# Patient Record
Sex: Male | Born: 1990
Health system: Southern US, Community
[De-identification: ages and names within clinical notes are randomized; demographics above are authoritative.]

## PROBLEM LIST (undated history)

## (undated) DIAGNOSIS — B009 Herpesviral infection, unspecified: Secondary | ICD-10-CM

## (undated) DIAGNOSIS — M5430 Sciatica, unspecified side: Secondary | ICD-10-CM

## (undated) DIAGNOSIS — G43909 Migraine, unspecified, not intractable, without status migrainosus: Secondary | ICD-10-CM

## (undated) DIAGNOSIS — K219 Gastro-esophageal reflux disease without esophagitis: Secondary | ICD-10-CM

## (undated) HISTORY — DX: Herpesviral infection, unspecified: B00.9

---
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
## 2013-02-15 ENCOUNTER — Encounter (HOSPITAL_BASED_OUTPATIENT_CLINIC_OR_DEPARTMENT_OTHER): Payer: Self-pay

## 2013-02-15 ENCOUNTER — Emergency Department (HOSPITAL_BASED_OUTPATIENT_CLINIC_OR_DEPARTMENT_OTHER)
Admission: EM | Admit: 2013-02-15 | Discharge: 2013-02-15 | Disposition: A | Discharge status: Home / Self Care | Payer: Medicaid Other | Attending: Emergency Medicine | Admitting: Emergency Medicine

## 2013-02-15 DIAGNOSIS — R143 Flatulence: Secondary | ICD-10-CM | POA: Insufficient documentation

## 2013-02-15 DIAGNOSIS — N342 Other urethritis: Secondary | ICD-10-CM

## 2013-02-15 DIAGNOSIS — R141 Gas pain: Secondary | ICD-10-CM | POA: Insufficient documentation

## 2013-02-15 DIAGNOSIS — R142 Eructation: Secondary | ICD-10-CM | POA: Insufficient documentation

## 2013-02-15 DIAGNOSIS — R109 Unspecified abdominal pain: Secondary | ICD-10-CM

## 2013-02-15 DIAGNOSIS — R1013 Epigastric pain: Secondary | ICD-10-CM | POA: Insufficient documentation

## 2013-02-15 DIAGNOSIS — K219 Gastro-esophageal reflux disease without esophagitis: Secondary | ICD-10-CM | POA: Insufficient documentation

## 2013-02-15 LAB — CBC WITH DIFFERENTIAL/PLATELET
Basophils Relative: 0 % (ref 0–1)
Eosinophils Absolute: 0.1 10*3/uL (ref 0.0–0.7)
Eosinophils Relative: 1 % (ref 0–5)
HCT: 49 % (ref 39.0–52.0)
Hemoglobin: 17.6 g/dL — ABNORMAL HIGH (ref 13.0–17.0)
Lymphocytes Relative: 29 % (ref 12–46)
Monocytes Absolute: 0.7 10*3/uL (ref 0.1–1.0)
Monocytes Relative: 8 % (ref 3–12)
Neutro Abs: 5.8 10*3/uL (ref 1.7–7.7)
Neutrophils Relative %: 62 % (ref 43–77)
WBC: 9.3 10*3/uL (ref 4.0–10.5)

## 2013-02-15 LAB — URINALYSIS, ROUTINE W REFLEX MICROSCOPIC
Bilirubin Urine: NEGATIVE
Glucose, UA: NEGATIVE mg/dL
Hgb urine dipstick: NEGATIVE
Specific Gravity, Urine: 1.025 (ref 1.005–1.030)
pH: 7.5 (ref 5.0–8.0)

## 2013-02-15 LAB — COMPREHENSIVE METABOLIC PANEL
ALT: 21 U/L (ref 0–53)
Albumin: 4.8 g/dL (ref 3.5–5.2)
BUN: 15 mg/dL (ref 6–23)
Calcium: 10.1 mg/dL (ref 8.4–10.5)
GFR calc Af Amer: >90 mL/min (ref >90)
Glucose, Bld: 99 mg/dL (ref 70–99)
Sodium: 139 mEq/L (ref 135–145)
Total Protein: 8.1 g/dL (ref 6.0–8.3)

## 2013-02-15 LAB — LIPASE, BLOOD: Lipase: 41 U/L (ref 11–59)

## 2013-02-15 LAB — URINE MICROSCOPIC-ADD ON

## 2013-02-15 MED ORDER — DOXYCYCLINE HYCLATE 100 MG PO CAPS: ORAL_CAPSULE | ORAL | Status: DC

## 2013-02-15 MED ORDER — OMEPRAZOLE 20 MG PO CPDR: 20.0000 mg | DELAYED_RELEASE_CAPSULE | Freq: Every day | ORAL | Status: DC

## 2013-02-15 NOTE — ED Provider Notes (Signed)
History     CSN: [id number]  Arrival date & time 02/15/13  1228   First MD Initiated Contact with Patient 02/15/13 1309      Chief Complaint  Patient presents with  . Abdominal Pain    (Consider location/radiation/quality/duration/timing/severity/associated sxs/prior treatment) HPI Comments: Patient is a 22 y/o M with no significant PMHx c/o epigastrum pain x 2 weeks. Patient described that pain is a constant, severe, aching, ganwing, burning sensation that is localized to the epigastrum without radiation. Patient reported pain gets worse when lying down and eating, stated that standing and walking the discomfort is much less. Patient reported using Milk of Magnesia and Peptobismol with no relief. Associated symptoms are feeling bloated. Patient denied dysphagia, odynphagia, sore throat, hemoptysis, hematemesis, hematochezia, melena, constipation, nausea, vomiting, diarrhea, sore throat, chest pain, shortness of breathe, difficulty breathing, dysuria, penile discharge, penile swelling.   Patient is a 22 y.o. male presenting with abdominal pain. The history is provided by the patient. No language interpreter was used.  Abdominal Pain Pain location:  Epigastric Pain quality: aching, bloating, burning, dull, fullness and gnawing   Pain quality: not cramping, not sharp and not shooting   Pain radiates to:  Does not radiate Pain severity:  Severe Onset quality:  Gradual Duration:  2 weeks Timing:  Constant Progression:  Unchanged Chronicity:  New Context: not alcohol use, not diet changes and not medication withdrawal   Relieved by:  Nothing Worsened by:  Position changes and eating Ineffective treatments:  OTC medications Associated symptoms: no chest pain, no chills, no constipation, no diarrhea, no dysuria, no fever, no hematochezia, no hematuria, no melena, no nausea, no shortness of breath, no sore throat and no vomiting   Risk factors: no alcohol abuse and no NSAID use      History reviewed. No pertinent past medical history.  History reviewed. No pertinent past surgical history.  History reviewed. No pertinent family history.  History  Substance Use Topics  . Smoking status: Passive Smoke Exposure - Never Smoker  . Smokeless tobacco: Never Used  . Alcohol Use: No      Review of Systems  Constitutional: Negative for fever and chills.  HENT: Negative for sore throat.   Respiratory: Negative for chest tightness and shortness of breath.   Cardiovascular: Negative for chest pain.  Gastrointestinal: Positive for abdominal pain. Negative for nausea, vomiting, diarrhea, constipation, blood in stool, melena and hematochezia.  Genitourinary: Negative for dysuria, hematuria, discharge and testicular pain.  Musculoskeletal: Negative for back pain.  Neurological: Negative for headaches.  All other systems reviewed and are negative.    Allergies  Review of patient's allergies indicates no known allergies.  Home Medications   Current Outpatient Rx  Name  Route  Sig  Dispense  Refill  . doxycycline (VIBRAMYCIN) 100 MG capsule      Take one PO BID x 7 days   14 capsule   0   . omeprazole (PRILOSEC) 20 MG capsule   Oral   Take 1 capsule (20 mg total) by mouth daily.   10 capsule   0     BP 128/78  Pulse 97  Temp(Src) 98.3 F (36.8 C) (Oral)  Resp 18  Ht 5\' 10"  (1.778 m)  Wt 170 lb (77.111 kg)  BMI 24.39 kg/m2  SpO2 99%  Physical Exam  Nursing note and vitals reviewed. Constitutional: He is oriented to person, place, and time. He appears well-developed and well-nourished. No distress.  HENT:  Head: Normocephalic and atraumatic.  Mouth/Throat: Oropharynx is clear and moist. No oropharyngeal exudate.  Eyes: Conjunctivae and EOM are normal. Pupils are equal, round, and reactive to light. Right eye exhibits no discharge. Left eye exhibits no discharge.  Neck: Normal range of motion. Neck supple. No tracheal deviation present.  Negative  lymphadenopathy  Cardiovascular: Normal rate, regular rhythm, normal heart sounds and intact distal pulses.  Exam reveals no friction rub.   No murmur heard. Pulmonary/Chest: Effort normal and breath sounds normal. No respiratory distress. He has no wheezes. He has no rales.  Abdominal: Soft. Bowel sounds are normal. He exhibits no distension and no mass. There is tenderness. There is no rebound and no guarding.  Pain upon palpation to epigastrum.  Non-tender in remaining 4 quadrants.  Negative Murphy's sign Negative Psoas sign Negative Obtruator sign  Lymphadenopathy:    He has no cervical adenopathy.  Neurological: He is alert and oriented to person, place, and time. No cranial nerve deficit. He exhibits normal muscle tone. Coordination normal.  Skin: Skin is warm and dry. No rash noted. He is not diaphoretic. No erythema.  Psychiatric: He has a normal mood and affect. His behavior is normal.    ED Course  Procedures (including critical care time)  Labs Reviewed  URINALYSIS, ROUTINE W REFLEX MICROSCOPIC - Abnormal; Notable for the following:    APPearance TURBID (*)    All other components within normal limits  URINE MICROSCOPIC-ADD ON - Abnormal; Notable for the following:    Bacteria, UA FEW (*)    All other components within normal limits  CBC WITH DIFFERENTIAL - Abnormal; Notable for the following:    RBC 6.11 (*)    Hemoglobin 17.6 (*)    All other components within normal limits  GC/CHLAMYDIA PROBE AMP  COMPREHENSIVE METABOLIC PANEL  LIPASE, BLOOD   Results for orders placed during the hospital encounter of 02/15/13  URINALYSIS, ROUTINE W REFLEX MICROSCOPIC      Result Value Range   Color, Urine YELLOW  YELLOW   APPearance TURBID (*) CLEAR   Specific Gravity, Urine 1.025  1.005 - 1.030   pH 7.5  5.0 - 8.0   Glucose, UA NEGATIVE  NEGATIVE mg/dL   Hgb urine dipstick NEGATIVE  NEGATIVE   Bilirubin Urine NEGATIVE  NEGATIVE   Ketones, ur NEGATIVE  NEGATIVE mg/dL    Protein, ur NEGATIVE  NEGATIVE mg/dL   Urobilinogen, UA 0.2  0.0 - 1.0 mg/dL   Nitrite NEGATIVE  NEGATIVE   Leukocytes, UA NEGATIVE  NEGATIVE  URINE MICROSCOPIC-ADD ON      Result Value Range   Bacteria, UA FEW (*) RARE   Urine-Other MUCOUS PRESENT    CBC WITH DIFFERENTIAL      Result Value Range   WBC 9.3  4.0 - 10.5 K/uL   RBC 6.11 (*) 4.22 - 5.81 MIL/uL   Hemoglobin 17.6 (*) 13.0 - 17.0 g/dL   HCT 19.1  47.8 - 29.5 %   MCV 80.2  78.0 - 100.0 fL   MCH 28.8  26.0 - 34.0 pg   MCHC 35.9  30.0 - 36.0 g/dL   RDW 62.1  30.8 - 65.7 %   Platelets 281  150 - 400 K/uL   Neutrophils Relative 62  43 - 77 %   Neutro Abs 5.8  1.7 - 7.7 K/uL   Lymphocytes Relative 29  12 - 46 %   Lymphs Abs 2.7  0.7 - 4.0 K/uL   Monocytes Relative 8  3 - 12 %  Monocytes Absolute 0.7  0.1 - 1.0 K/uL   Eosinophils Relative 1  0 - 5 %   Eosinophils Absolute 0.1  0.0 - 0.7 K/uL   Basophils Relative 0  0 - 1 %   Basophils Absolute 0.0  0.0 - 0.1 K/uL  COMPREHENSIVE METABOLIC PANEL      Result Value Range   Sodium 139  135 - 145 mEq/L   Potassium 3.7  3.5 - 5.1 mEq/L   Chloride 102  96 - 112 mEq/L   CO2 25  19 - 32 mEq/L   Glucose, Bld 99  70 - 99 mg/dL   BUN 15  6 - 23 mg/dL   Creatinine, Ser 9.56  0.50 - 1.35 mg/dL   Calcium 21.3  8.4 - 08.6 mg/dL   Total Protein 8.1  6.0 - 8.3 g/dL   Albumin 4.8  3.5 - 5.2 g/dL   AST 18  0 - 37 U/L   ALT 21  0 - 53 U/L   Alkaline Phosphatase 95  39 - 117 U/L   Total Bilirubin 0.6  0.3 - 1.2 mg/dL   GFR calc non Af Amer >90  >90 mL/min   GFR calc Af Amer >90  >90 mL/min  LIPASE, BLOOD      Result Value Range   Lipase 41  11 - 59 U/L    Filed Vitals:   02/15/13 1242  BP: 128/78  Pulse: 97  Temp: 98.3 F (36.8 C)  Resp: 18     1. GERD (gastroesophageal reflux disease)   2. Abdominal pain   3. Urethritis, unspecified       MDM  DDx: GERD Acute Pancreatitis  H. Pylori  Patient aseptic, non-toxic appearing. Patient afrebrile, normotensive,  non-tachycardic, alert, happy affect. Negative findings to CBC, CMP. Lipase within normal limits (41) r/o pancreatitis. Suspected GERD. Possible urethritis due to presence of bacteria in urine - sent specimen for GC/Chlamydia testing - independently spoke with patient, patient reported being sexually active, but used protection - discussed with patient the importance of safe sex - patient denied penile discharge, inflammation, swelling, erythema. Discharged patient with antibiotics and PPI. Discussed with patient how to take medications - discussed to decrease intake of spicy foods and alcohol. Discussed to stay hydrated. Recommended seeing GI doctor. Discussed if symptoms are to worsen please report to the ED.  Patient agreed to plan of care, understood, all questions answered.        Raymon Mutton, PA-C 02/16/13 [phone]

## 2013-02-15 NOTE — ED Notes (Signed)
Pt states that he has severe pain in the epigastrum x2 weeks.  Pt has taken multiple otc medications and no relief.  Pt states that he does not have any nausea, vomiting, diarrhea, constipation, fever. Incr belching.

## 2013-02-16 NOTE — ED Provider Notes (Signed)
Medical screening examination/treatment/procedure(s) were performed by non-physician practitioner and as supervising physician I was immediately available for consultation/collaboration.  Mirabelle Cyphers R. Eustacia Urbanek, MD 02/16/13 1616 

## 2013-02-18 LAB — NEISSERIA GONORRHOEAE, PROBE AMP: GC Probe RNA: NEGATIVE

## 2013-02-22 NOTE — ED Provider Notes (Signed)
Reviewed results - GC and CT probe are negative.   Raymon Mutton, PA-C 02/22/13 1102

## 2013-02-24 NOTE — ED Provider Notes (Signed)
Medical screening examination/treatment/procedure(s) were performed by non-physician practitioner and as supervising physician I was immediately available for consultation/collaboration.  Juliet Rude. Rubin Payor, MD 02/24/13 [phone]

## 2014-03-25 ENCOUNTER — Encounter (HOSPITAL_BASED_OUTPATIENT_CLINIC_OR_DEPARTMENT_OTHER): Payer: Self-pay | Admitting: Emergency Medicine

## 2014-03-25 ENCOUNTER — Emergency Department (HOSPITAL_BASED_OUTPATIENT_CLINIC_OR_DEPARTMENT_OTHER)
Admission: EM | Admit: 2014-03-25 | Discharge: 2014-03-25 | Disposition: A | Discharge status: Home / Self Care | Payer: BC Managed Care – PPO | Attending: Emergency Medicine | Admitting: Emergency Medicine

## 2014-03-25 ENCOUNTER — Emergency Department (HOSPITAL_BASED_OUTPATIENT_CLINIC_OR_DEPARTMENT_OTHER): Payer: BC Managed Care – PPO

## 2014-03-25 DIAGNOSIS — K219 Gastro-esophageal reflux disease without esophagitis: Secondary | ICD-10-CM | POA: Insufficient documentation

## 2014-03-25 DIAGNOSIS — X500XXA Overexertion from strenuous movement or load, initial encounter: Secondary | ICD-10-CM | POA: Insufficient documentation

## 2014-03-25 DIAGNOSIS — S93402A Sprain of unspecified ligament of left ankle, initial encounter: Secondary | ICD-10-CM

## 2014-03-25 DIAGNOSIS — S93409A Sprain of unspecified ligament of unspecified ankle, initial encounter: Secondary | ICD-10-CM | POA: Insufficient documentation

## 2014-03-25 DIAGNOSIS — Y929 Unspecified place or not applicable: Secondary | ICD-10-CM | POA: Insufficient documentation

## 2014-03-25 DIAGNOSIS — Y939 Activity, unspecified: Secondary | ICD-10-CM | POA: Insufficient documentation

## 2014-03-25 DIAGNOSIS — Z79899 Other long term (current) drug therapy: Secondary | ICD-10-CM | POA: Insufficient documentation

## 2014-03-25 HISTORY — DX: Gastro-esophageal reflux disease without esophagitis: K21.9

## 2014-03-25 NOTE — Discharge Instructions (Signed)

## 2014-03-25 NOTE — ED Notes (Signed)
Langston MaskerKaren Sofia, PA-C at bedside for evaluation.

## 2014-03-25 NOTE — ED Provider Notes (Signed)
CSN: [id number]     Arrival date & time 03/25/14  1101 History   First MD Initiated Contact with Patient 03/25/14 1207     Chief Complaint  Patient presents with  . Ankle Injury     (Consider location/radiation/quality/duration/timing/severity/associated sxs/prior Treatment) Patient is a 23 y.o. male presenting with ankle pain. The history is provided by the patient. No language interpreter was used.  Ankle Pain Location:  Ankle Injury: no   Ankle location:  L ankle Pain details:    Quality:  Aching   Severity:  Moderate   Onset quality:  Gradual   Timing:  Constant   Progression:  Worsening Dislocation: no   Prior injury to area:  No Worsened by:  Nothing tried Ineffective treatments:  None tried Risk factors: no recent illness     Past Medical History  Diagnosis Date  . GERD (gastroesophageal reflux disease)    History reviewed. No pertinent past surgical history. No family history on file. History  Substance Use Topics  . Smoking status: Never Smoker   . Smokeless tobacco: Never Used  . Alcohol Use: No    Review of Systems  All other systems reviewed and are negative.     Allergies  Review of patient's allergies indicates no known allergies.  Home Medications   Prior to Admission medications   Medication Sig Start Date End Date Taking? Authorizing Provider  doxycycline (VIBRAMYCIN) 100 MG capsule Take one PO BID x 7 days 02/15/13   Marissa Sciacca, PA-C  omeprazole (PRILOSEC) 20 MG capsule Take 1 capsule (20 mg total) by mouth daily. 02/15/13   Marissa Sciacca, PA-C   BP 136/70  Pulse 94  Temp(Src) 98 F (36.7 C) (Oral)  Resp 20  Ht 5\' 9"  (1.753 m)  Wt 190 lb (86.183 kg)  BMI 28.05 kg/m2  SpO2 98% Physical Exam  Vitals reviewed. Constitutional: He is oriented to person, place, and time. He appears well-developed and well-nourished.  HENT:  Head: Normocephalic.  Neck: Normal range of motion.  Musculoskeletal: He exhibits tenderness.  Tender left  ankle,   Decreased range of motion,   Nv and ns intact.    Neurological: He is alert and oriented to person, place, and time.  Skin: Skin is warm.  Psychiatric: He has a normal mood and affect.    ED Course  Procedures (including critical care time) Labs Review Labs Reviewed - No data to display  Imaging Review Dg Ankle Complete Left  03/25/2014   CLINICAL DATA:  Left ankle injury 04/02/2014.  Pain.  EXAM: LEFT ANKLE COMPLETE - 3+ VIEW  COMPARISON:  None.  FINDINGS: Imaged bones, joints and soft tissues appear normal.  IMPRESSION: Negative exam.   Electronically Signed   By: Drusilla Kannerhomas  Dalessio M.D.   On: 03/25/2014 11:40     EKG Interpretation None      MDM   Final diagnoses:  Sprain of ankle, left    aso Follow up with Dr. Pearletha Forgehudnall for recheck in 1 week    Jesse AreasLeslie K Sofia, PA-C 03/25/14 1306

## 2014-03-25 NOTE — ED Provider Notes (Signed)
Medical screening examination/treatment/procedure(s) were performed by non-physician practitioner and as supervising physician I was immediately available for consultation/collaboration.   EKG Interpretation None       Derwood KaplanAnkit Ladarrius Bogdanski, MD 03/25/14 1621

## 2014-03-25 NOTE — ED Notes (Signed)
Pt reports "rolled it" left ankle 02/21/14

## 2015-11-07 ENCOUNTER — Emergency Department (HOSPITAL_BASED_OUTPATIENT_CLINIC_OR_DEPARTMENT_OTHER)
Admission: EM | Admit: 2015-11-07 | Discharge: 2015-11-07 | Disposition: A | Discharge status: Home / Self Care | Payer: Medicaid Other | Attending: Emergency Medicine | Admitting: Emergency Medicine

## 2015-11-07 ENCOUNTER — Emergency Department (HOSPITAL_BASED_OUTPATIENT_CLINIC_OR_DEPARTMENT_OTHER): Payer: Medicaid Other

## 2015-11-07 ENCOUNTER — Encounter (HOSPITAL_BASED_OUTPATIENT_CLINIC_OR_DEPARTMENT_OTHER): Payer: Self-pay | Admitting: Emergency Medicine

## 2015-11-07 DIAGNOSIS — Z792 Long term (current) use of antibiotics: Secondary | ICD-10-CM | POA: Insufficient documentation

## 2015-11-07 DIAGNOSIS — S3992XA Unspecified injury of lower back, initial encounter: Secondary | ICD-10-CM | POA: Insufficient documentation

## 2015-11-07 DIAGNOSIS — Y998 Other external cause status: Secondary | ICD-10-CM | POA: Insufficient documentation

## 2015-11-07 DIAGNOSIS — K219 Gastro-esophageal reflux disease without esophagitis: Secondary | ICD-10-CM | POA: Insufficient documentation

## 2015-11-07 DIAGNOSIS — Y9289 Other specified places as the place of occurrence of the external cause: Secondary | ICD-10-CM | POA: Insufficient documentation

## 2015-11-07 DIAGNOSIS — X500XXA Overexertion from strenuous movement or load, initial encounter: Secondary | ICD-10-CM | POA: Insufficient documentation

## 2015-11-07 DIAGNOSIS — Y9389 Activity, other specified: Secondary | ICD-10-CM | POA: Insufficient documentation

## 2015-11-07 DIAGNOSIS — Z79899 Other long term (current) drug therapy: Secondary | ICD-10-CM | POA: Insufficient documentation

## 2015-11-07 DIAGNOSIS — M5432 Sciatica, left side: Secondary | ICD-10-CM | POA: Insufficient documentation

## 2015-11-07 MED ORDER — HYDROCODONE-ACETAMINOPHEN 5-325 MG PO TABS: 1.0000 | ORAL_TABLET | Freq: Four times a day (QID) | ORAL | Status: DC | PRN

## 2015-11-07 MED ORDER — CYCLOBENZAPRINE HCL 10 MG PO TABS
5.0000 mg | ORAL_TABLET | Freq: Once | ORAL | Status: AC
  Administered 2015-11-07: 5 mg via ORAL
  Filled 2015-11-07: qty 1

## 2015-11-07 MED ORDER — HYDROCODONE-ACETAMINOPHEN 5-325 MG PO TABS
1.0000 | ORAL_TABLET | Freq: Once | ORAL | Status: AC
Start: 2015-11-07 — End: 2015-11-07
  Administered 2015-11-07: 1 via ORAL
  Filled 2015-11-07: qty 1

## 2015-11-07 MED ORDER — CYCLOBENZAPRINE HCL 10 MG PO TABS: 10.0000 mg | ORAL_TABLET | Freq: Two times a day (BID) | ORAL | Status: DC | PRN

## 2015-11-07 MED ORDER — KETOROLAC TROMETHAMINE 60 MG/2ML IM SOLN
60.0000 mg | Freq: Once | INTRAMUSCULAR | Status: AC
  Administered 2015-11-07: 60 mg via INTRAMUSCULAR
  Filled 2015-11-07: qty 2

## 2015-11-07 MED ORDER — DICLOFENAC SODIUM 50 MG PO TBEC: 50.0000 mg | DELAYED_RELEASE_TABLET | Freq: Two times a day (BID) | ORAL | Status: DC

## 2015-11-07 NOTE — Discharge Instructions (Signed)
Do not take the muscle relaxant or narcotic if driving or working because they will make you sleepy.

## 2015-11-07 NOTE — ED Notes (Signed)
Patient states that about an hour ago he was lifting 130 lbs and he felt his back pull. The patient states that bending down makes it hurt worse.

## 2015-11-07 NOTE — ED Provider Notes (Signed)
CSN: [id number]     Arrival date & time 11/07/15  2035 History   First MD Initiated Contact with Patient 11/07/15 2122     Chief Complaint  Patient presents with  . Back Pain     (Consider location/radiation/quality/duration/timing/severity/associated sxs/prior Treatment) Patient is a 24 y.o. male presenting with back pain. The history is provided by the patient.  Back Pain Location:  Lumbar spine Quality:  Aching and shooting Radiates to: left buttock. Pain severity:  Moderate Onset quality:  Sudden Timing:  Constant Progression:  Worsening Chronicity:  New Context: lifting heavy objects   Relieved by:  Nothing Worsened by:  Ambulation, movement and bending Ineffective treatments:  Ibuprofen Associated symptoms: no bladder incontinence and no bowel incontinence    Jesse Alexander is a 24 y.o. male who presents to the ED with lower back pain that started after lifting weights tonight, 130 pounds. Patient reports feeling a burning sensation down the left side of his lower back into his left buttock. He denies loss of bladder or bowel.   Past Medical History  Diagnosis Date  . GERD (gastroesophageal reflux disease)    History reviewed. No pertinent past surgical history. History reviewed. No pertinent family history. Social History  Substance Use Topics  . Smoking status: Never Smoker   . Smokeless tobacco: Never Used  . Alcohol Use: No    Review of Systems  Gastrointestinal: Negative for bowel incontinence.  Genitourinary: Negative for bladder incontinence.  Musculoskeletal: Positive for back pain.  all other systems negative    Allergies  Review of patient's allergies indicates no known allergies.  Home Medications   Prior to Admission medications   Medication Sig Start Date End Date Taking? Authorizing Provider  cyclobenzaprine (FLEXERIL) 10 MG tablet Take 1 tablet (10 mg total) by mouth 2 (two) times daily as needed for muscle spasms. 11/07/15   Hope Orlene OchM  Neese, NP  diclofenac (VOLTAREN) 50 MG EC tablet Take 1 tablet (50 mg total) by mouth 2 (two) times daily. 11/07/15   Hope Orlene OchM Neese, NP  doxycycline (VIBRAMYCIN) 100 MG capsule Take one PO BID x 7 days 02/15/13   Marissa Sciacca, PA-C  HYDROcodone-acetaminophen (NORCO) 5-325 MG tablet Take 1 tablet by mouth every 6 (six) hours as needed for moderate pain. 11/07/15   Hope Orlene OchM Neese, NP  omeprazole (PRILOSEC) 20 MG capsule Take 1 capsule (20 mg total) by mouth daily. 02/15/13   Marissa Sciacca, PA-C   BP 118/75 mmHg  Pulse 81  Temp(Src) 98.5 F (36.9 C) (Oral)  Resp 16  Ht 5\' 9"  (1.753 m)  Wt 101.152 kg  BMI 32.92 kg/m2  SpO2 97% Physical Exam  Constitutional: He is oriented to person, place, and time. He appears well-developed and well-nourished. No distress.  HENT:  Head: Normocephalic and atraumatic.  Eyes: EOM are normal. Pupils are equal, round, and reactive to light.  Neck: Normal range of motion. Neck supple.  Cardiovascular: Normal rate and regular rhythm.   Pulmonary/Chest: Effort normal. No respiratory distress. He has no wheezes. He has no rales.  Abdominal: Soft. Bowel sounds are normal. There is no tenderness.  Musculoskeletal: Normal range of motion. He exhibits no edema.       Lumbar back: He exhibits tenderness. He exhibits normal range of motion, no deformity, no spasm and normal pulse.  Neurological: He is alert and oriented to person, place, and time. He has normal strength. No cranial nerve deficit or sensory deficit. Coordination and gait normal.  Reflex Scores:  Bicep reflexes are 2+ on the right side and 2+ on the left side.      Brachioradialis reflexes are 2+ on the right side and 2+ on the left side.      Patellar reflexes are 2+ on the right side and 2+ on the left side.      Achilles reflexes are 2+ on the right side and 2+ on the left side. Skin: Skin is warm and dry.  Psychiatric: He has a normal mood and affect. His behavior is normal.  Nursing note and  vitals reviewed.   ED Course  Procedures (including critical care time) Flexeril, hydrocodone, Toradol  Labs Review Labs Reviewed - No data to display  Imaging Review Dg Lumbar Spine Complete  11/07/2015  CLINICAL DATA:  24 year old male with back pain EXAM: LUMBAR SPINE - COMPLETE 4+ VIEW COMPARISON:  None. FINDINGS: There is no evidence of lumbar spine fracture. Alignment is normal. Intervertebral disc spaces are maintained. IMPRESSION: No acute/ traumatic lumbar spine pathology. Electronically Signed   By: Elgie Collard M.D.   On: 11/07/2015 21:35    MDM  24 y.o. male with low back pain after weight lifting earlier tonight. Pain improved with medication. Stable for d/c without neuro deficits and normal lumbar spine film. No red flags to indicate immediate neuro consult. Will continue treatment with muscle spasm and pain. Patient will follow up with Dr. Pearletha Forge. Discussed with the patient x-ray and clinical findings and plan of care. All questioned fully answered. He will return if any problems arise.   Final diagnoses:  Lumbosacral injury, initial encounter  Sciatica, left      Janne Napoleon, NP 11/08/15 0150  Rolan Bucco, MD 11/11/15 [phone]

## 2016-12-13 ENCOUNTER — Encounter (HOSPITAL_BASED_OUTPATIENT_CLINIC_OR_DEPARTMENT_OTHER): Payer: Self-pay

## 2016-12-13 ENCOUNTER — Emergency Department (HOSPITAL_BASED_OUTPATIENT_CLINIC_OR_DEPARTMENT_OTHER)
Admission: EM | Admit: 2016-12-13 | Discharge: 2016-12-13 | Disposition: A | Discharge status: Home / Self Care | Payer: Medicaid Other | Attending: Emergency Medicine | Admitting: Emergency Medicine

## 2016-12-13 DIAGNOSIS — H9311 Tinnitus, right ear: Secondary | ICD-10-CM | POA: Insufficient documentation

## 2016-12-13 DIAGNOSIS — R110 Nausea: Secondary | ICD-10-CM | POA: Insufficient documentation

## 2016-12-13 DIAGNOSIS — H539 Unspecified visual disturbance: Secondary | ICD-10-CM | POA: Insufficient documentation

## 2016-12-13 DIAGNOSIS — R509 Fever, unspecified: Secondary | ICD-10-CM | POA: Insufficient documentation

## 2016-12-13 DIAGNOSIS — H5500 Unspecified nystagmus: Secondary | ICD-10-CM | POA: Insufficient documentation

## 2016-12-13 DIAGNOSIS — R42 Dizziness and giddiness: Secondary | ICD-10-CM | POA: Insufficient documentation

## 2016-12-13 DIAGNOSIS — H9191 Unspecified hearing loss, right ear: Secondary | ICD-10-CM | POA: Insufficient documentation

## 2016-12-13 HISTORY — DX: Sciatica, unspecified side: M54.30

## 2016-12-13 MED ORDER — MECLIZINE HCL 25 MG PO TABS: 25.0000 mg | ORAL_TABLET | Freq: Three times a day (TID) | ORAL | 0 refills | Status: DC | PRN

## 2016-12-13 MED FILL — MECLIZINE 25 MG TABLET: 25 | 10 days supply | Qty: 30 | Fill #0

## 2016-12-13 NOTE — ED Notes (Signed)
ED Provider at bedside. 

## 2016-12-13 NOTE — ED Provider Notes (Signed)
[facility]   CSN: [id number] Arrival date & time: 12/13/16  1212  By signing my name below, I, Linna Darner, attest that this documentation has been prepared under the direction and in the presence of Langston Masker, [place]. Electronically Signed: Linna Darner, Scribe. 12/13/2016. 4:24 PM.  History   Chief Complaint Chief Complaint  Patient presents with  . Dizziness    The history is provided by the patient. No language interpreter was used.     HPI Comments: Jesse Alexander is a 26 y.o. male who presents to the Emergency Department complaining of intermittent dizziness for 3 weeks. He reports associated blurred vision, nausea, tinnitus (R), hearing loss (R), difficulty balancing, difficulty concentrating, and difficulty with motor skills. He reports occasional subjective fever/chills as well. He is concerned for vertigo. Pt has a history of migraines. No regular medications. NKDA. He denies ear pain, vomiting, syncope, or any other associated symptoms. No PCP.  Past Medical History:  Diagnosis Date  . GERD (gastroesophageal reflux disease)   . Sciatica     There are no active problems to display for this patient.   History reviewed. No pertinent surgical history.     Home Medications    Prior to Admission medications   Not on File    Family History No family history on file.  Social History Social History  Substance Use Topics  . Smoking status: Never Smoker  . Smokeless tobacco: Never Used  . Alcohol use No     Allergies   Patient has no known allergies.   Review of Systems Review of Systems  Constitutional: Positive for chills and fever.  HENT: Positive for hearing loss (R) and tinnitus (R). Negative for ear pain.   Eyes: Positive for visual disturbance.  Gastrointestinal: Positive for nausea. Negative for vomiting.  Neurological: Positive for dizziness. Negative for syncope.  Psychiatric/Behavioral: Positive for decreased  concentration.  All other systems reviewed and are negative.    Physical Exam Updated Vital Signs BP 119/72 (BP Location: Right Arm)   Pulse 83   Temp 98.5 F (36.9 C) (Oral)   Resp 18   Ht 5\' 8"  (1.727 m)   Wt 235 lb (106.6 kg)   SpO2 98%   BMI 35.73 kg/m   Physical Exam  Constitutional: He is oriented to person, place, and time. He appears well-developed and well-nourished. No distress.  HENT:  Head: Normocephalic and atraumatic.  Eyes: Conjunctivae are normal. Left eye exhibits nystagmus.  Left lower lateral nystagmus.  Neck: Neck supple. No tracheal deviation present.  Cardiovascular: Normal rate.   Pulmonary/Chest: Effort normal. No respiratory distress.  Musculoskeletal: Normal range of motion.  Neurological: He is alert and oriented to person, place, and time.  Skin: Skin is warm and dry.  Psychiatric: He has a normal mood and affect. His behavior is normal.  Nursing note and vitals reviewed.    ED Treatments / Results  Labs (all labs ordered are listed, but only abnormal results are displayed) Labs Reviewed - No data to display  EKG  EKG Interpretation None       Radiology No results found.  Procedures Procedures (including critical care time)  DIAGNOSTIC STUDIES: Oxygen Saturation is 99% on RA, normal by my interpretation.    COORDINATION OF CARE: 4:31 PM Discussed treatment plan with pt at bedside and pt agreed to plan.  Medications Ordered in ED Medications - No data to display   Initial Impression / Assessment and Plan / ED Course  I have reviewed the triage vital signs and the nursing notes.  Pertinent labs & imaging results that were available during my care of the patient were reviewed by me and considered in my medical decision making (see chart for details).     Pt advised of need for primary care follow up   Final Clinical Impressions(s) / ED Diagnoses   Final diagnoses:  Vertigo    New Prescriptions New Prescriptions    MECLIZINE (ANTIVERT) 25 MG TABLET    Take 1 tablet (25 mg total) by mouth 3 (three) times daily as needed for dizziness.    I personally performed the services in this documentation, which was scribed in my presence.  The recorded information has been reviewed and considered.   Barnet PallKaren SofiaPAC.   Lonia SkinnerLeslie K MaeystownSofia, PA-C 12/13/16 1639    Loren Raceravid Yelverton, MD 12/14/16 [id number]

## 2016-12-13 NOTE — ED Notes (Signed)
Family visitor with Pt at this time

## 2016-12-13 NOTE — ED Triage Notes (Signed)
C/o dizziness, "hinderence of motor skills", pressure to left side of head and tinnitus-NAD-steady gait

## 2017-03-26 ENCOUNTER — Encounter: Payer: Self-pay | Admitting: Urgent Care

## 2017-03-26 ENCOUNTER — Ambulatory Visit (INDEPENDENT_AMBULATORY_CARE_PROVIDER_SITE_OTHER): Payer: 59 | Admitting: Urgent Care

## 2017-03-26 VITALS — BP 126/78 | HR 91 | Temp 98.7°F | Resp 16 | Ht 69.0 in | Wt 218.6 lb

## 2017-03-26 DIAGNOSIS — J029 Acute pharyngitis, unspecified: Secondary | ICD-10-CM

## 2017-03-26 DIAGNOSIS — J101 Influenza due to other identified influenza virus with other respiratory manifestations: Secondary | ICD-10-CM | POA: Diagnosis not present

## 2017-03-26 DIAGNOSIS — R5381 Other malaise: Secondary | ICD-10-CM | POA: Diagnosis not present

## 2017-03-26 DIAGNOSIS — R5383 Other fatigue: Secondary | ICD-10-CM | POA: Diagnosis not present

## 2017-03-26 DIAGNOSIS — R059 Cough, unspecified: Secondary | ICD-10-CM

## 2017-03-26 DIAGNOSIS — R51 Headache: Secondary | ICD-10-CM

## 2017-03-26 DIAGNOSIS — R05 Cough: Secondary | ICD-10-CM

## 2017-03-26 DIAGNOSIS — R519 Headache, unspecified: Secondary | ICD-10-CM

## 2017-03-26 LAB — POC INFLUENZA A&B (BINAX/QUICKVUE)
Influenza A, POC: POSITIVE — AB
Influenza B, POC: NEGATIVE

## 2017-03-26 LAB — POCT RAPID STREP A (OFFICE): Rapid Strep A Screen: NEGATIVE

## 2017-03-26 MED ORDER — BENZONATATE 100 MG PO CAPS: 100.0000 mg | ORAL_CAPSULE | Freq: Three times a day (TID) | ORAL | 0 refills | Status: DC | PRN

## 2017-03-26 MED ORDER — PSEUDOEPHEDRINE HCL ER 120 MG PO TB12
120.0000 mg | ORAL_TABLET | Freq: Two times a day (BID) | ORAL | 3 refills | Status: DC
Start: 2017-03-26 — End: 2018-02-19

## 2017-03-26 MED ORDER — HYDROCODONE-HOMATROPINE 5-1.5 MG/5ML PO SYRP: 5.0000 mL | ORAL_SOLUTION | Freq: Every evening | ORAL | 0 refills | Status: DC | PRN

## 2017-03-26 NOTE — Patient Instructions (Addendum)
Influenza, Adult Influenza, more commonly known as "the flu," is a viral infection that primarily affects the respiratory tract. The respiratory tract includes organs that help you breathe, such as the lungs, nose, and throat. The flu causes many common cold symptoms, as well as a high fever and body aches. The flu spreads easily from person to person (is contagious). Getting a flu shot (influenza vaccination) every year is the best way to prevent influenza. What are the causes? Influenza is caused by a virus. You can catch the virus by:  Breathing in droplets from an infected person's cough or sneeze.  Touching something that was recently contaminated with the virus and then touching your mouth, nose, or eyes.  What increases the risk? The following factors may make you more likely to get the flu:  Not cleaning your hands frequently with soap and water or alcohol-based hand sanitizer.  Having close contact with many people during cold and flu season.  Touching your mouth, eyes, or nose without washing or sanitizing your hands first.  Not drinking enough fluids or not eating a healthy diet.  Not getting enough sleep or exercise.  Being under a high amount of stress.  Not getting a yearly (annual) flu shot.  You may be at a higher risk of complications from the flu, such as a severe lung infection (pneumonia), if you:  Are over the age of 65.  Are pregnant.  Have a weakened disease-fighting system (immune system). You may have a weakened immune system if you: ? Have HIV or AIDS. ? Are undergoing chemotherapy. ? Aretaking medicines that reduce the activity of (suppress) the immune system.  Have a long-term (chronic) illness, such as heart disease, kidney disease, diabetes, or lung disease.  Have a liver disorder.  Are obese.  Have anemia.  What are the signs or symptoms? Symptoms of this condition typically last 4-10 days and may  include:  Fever.  Chills.  Headache, body aches, or muscle aches.  Sore throat.  Cough.  Runny or congested nose.  Chest discomfort and cough.  Poor appetite.  Weakness or tiredness (fatigue).  Dizziness.  Nausea or vomiting.  How is this diagnosed? This condition may be diagnosed based on your medical history and a physical exam. Your health care provider may do a nose or throat swab test to confirm the diagnosis. How is this treated? If influenza is detected early, you can be treated with antiviral medicine that can reduce the length of your illness and the severity of your symptoms. This medicine may be given by mouth (orally) or through an IV tube that is inserted in one of your veins. The goal of treatment is to relieve symptoms by taking care of yourself at home. This may include taking over-the-counter medicines, drinking plenty of fluids, and adding humidity to the air in your home. In some cases, influenza goes away on its own. Severe influenza or complications from influenza may be treated in a hospital. Follow these instructions at home:  Take over-the-counter and prescription medicines only as told by your health care provider.  Use a cool mist humidifier to add humidity to the air in your home. This can make breathing easier.  Rest as needed.  Drink enough fluid to keep your urine clear or pale yellow.  Cover your mouth and nose when you cough or sneeze.  Wash your hands with soap and water often, especially after you cough or sneeze. If soap and water are not available, use hand sanitizer.    Stay home from work or school as told by your health care provider. Unless you are visiting your health care provider, try to avoid leaving home until your fever has been gone for 24 hours without the use of medicine.  Keep all follow-up visits as told by your health care provider. This is important. How is this prevented?  Getting an annual flu shot is the best way  to avoid getting the flu. You may get the flu shot in late summer, fall, or winter. Ask your health care provider when you should get your flu shot.  Wash your hands often or use hand sanitizer often.  Avoid contact with people who are sick during cold and flu season.  Eat a healthy diet, drink plenty of fluids, get enough sleep, and exercise regularly. Contact a health care provider if:  You develop new symptoms.  You have: ? Chest pain. ? Diarrhea. ? A fever.  Your cough gets worse.  You produce more mucus.  You feel nauseous or you vomit. Get help right away if:  You develop shortness of breath or difficulty breathing.  Your skin or nails turn a bluish color.  You have severe pain or stiffness in your neck.  You develop a sudden headache or sudden pain in your face or ear.  You cannot stop vomiting. This information is not intended to replace advice given to you by your health care provider. Make sure you discuss any questions you have with your health care provider. Document Released: 11/03/2000 Document Revised: 04/13/2016 Document Reviewed: 08/31/2015 Elsevier Interactive Patient Education  2017 [employer]    IF you received an x-ray today, you will receive an invoice from [facility]. Please contact [facility] at [phone] with questions or concerns regarding your invoice.   IF you received labwork today, you will receive an invoice from [facility]. Please contact [facility] at [phone] with questions or concerns regarding your invoice.   Our billing staff will not be able to assist you with questions regarding bills from these companies.  You will be contacted with the lab results as soon as they are available. The fastest way to get your results is to activate your My Chart account. Instructions are located on the last page of this paperwork. If you have not heard from us regarding the results in 2 weeks, please contact this office.       

## 2017-03-26 NOTE — Progress Notes (Signed)
  MRN: [id number] DOB: 09/17/1991  Subjective:   Jesse Alexander is a 26 y.o. male presenting for chief complaint of Headache (with cough and fever, per patient appx 2 days) and Fatigue (since last night)  Reports 2 day history of fatigue, headache, subjective fever, achy neck, mildly productive cough, sore throat, mild sinus pain and sinus congestion. Has tried Advil with some relief of headache. Has been hydrating well. Denies ear pain, eye pain, chest pain, shob, wheezing, n/v, abdominal pain, rashes. Reports intentional weight loss to try and be healthier. Has a history of allergies, does not take anything consistently for this. Denies history of asthma. Denies smoking cigarettes.   Jesse Alexander is not currently taking any medications.  Also has No Known Allergies. Jesse Alexander  has a past medical history of GERD (gastroesophageal reflux disease) and Sciatica. Also denies past surgical history. His family history includes Depression in his mother.    Objective:   Vitals: BP 126/78 (BP Location: Right Arm, Patient Position: Sitting, Cuff Size: Large)   Pulse 91   Temp 98.7 F (37.1 C) (Oral)   Resp 16   Ht 5\' 9"  (1.753 m)   Wt 218 lb 9.6 oz (99.2 kg)   SpO2 98%   BMI 32.28 kg/m   Physical Exam  Constitutional: He is oriented to person, place, and time. He appears well-developed and well-nourished.  HENT:  TM's intact bilaterally, no effusions or erythema. Nasal turbinates pink and moist, nasal passages patent. No sinus tenderness. Throat with slight erythema and tonsillar edema but no exudates, mucous membranes moist.  Eyes: Right eye exhibits no discharge. Left eye exhibits no discharge.  Neck: Normal range of motion. Neck supple.  Cardiovascular: Normal rate, regular rhythm and intact distal pulses.  Exam reveals no gallop and no friction rub.   No murmur heard. Pulmonary/Chest: No respiratory distress. He has no wheezes. He has no rales.  Lymphadenopathy:    He has no cervical  adenopathy.  Neurological: He is alert and oriented to person, place, and time.  Skin: Skin is warm and dry.  Psychiatric: He has a normal mood and affect.   Results for orders placed or performed in visit on 03/26/17 (from the past 24 hour(s))  POC Influenza A&B(BINAX/QUICKVUE)     Status: Abnormal   Collection Time: 03/26/17  2:05 PM  Result Value Ref Range   Influenza A, POC Positive (A) Negative   Influenza B, POC Negative Negative  POCT rapid strep A     Status: Normal   Collection Time: 03/26/17  2:26 PM  Result Value Ref Range   Rapid Strep A Screen Negative Negative   Assessment and Plan :   1. Influenza A 2. Acute nonintractable headache, unspecified headache type 3. Other fatigue 4. Malaise 5. Cough 6. Sore throat - Recommended supportive care. Strep culture pending. RTC in 1 week if no improvement.  Wallis BambergMario Nagee Goates, PA-C [facility] [phone] 03/26/2017  1:43 PM

## 2017-03-28 LAB — CULTURE, GROUP A STREP

## 2017-03-29 ENCOUNTER — Other Ambulatory Visit: Payer: Self-pay | Admitting: Urgent Care

## 2017-03-29 MED ORDER — AMOXICILLIN 875 MG PO TABS: 875.0000 mg | ORAL_TABLET | Freq: Two times a day (BID) | ORAL | 0 refills | Status: DC

## 2017-04-05 ENCOUNTER — Ambulatory Visit: Payer: 59 | Admitting: Urgent Care

## 2018-01-31 ENCOUNTER — Other Ambulatory Visit: Payer: Self-pay

## 2018-01-31 ENCOUNTER — Emergency Department (HOSPITAL_BASED_OUTPATIENT_CLINIC_OR_DEPARTMENT_OTHER): Payer: 59

## 2018-01-31 ENCOUNTER — Emergency Department (HOSPITAL_BASED_OUTPATIENT_CLINIC_OR_DEPARTMENT_OTHER)
Admission: EM | Admit: 2018-01-31 | Discharge: 2018-01-31 | Disposition: A | Discharge status: Home / Self Care | Payer: 59 | Attending: Emergency Medicine | Admitting: Emergency Medicine

## 2018-01-31 ENCOUNTER — Encounter (HOSPITAL_BASED_OUTPATIENT_CLINIC_OR_DEPARTMENT_OTHER): Payer: Self-pay | Admitting: Emergency Medicine

## 2018-01-31 DIAGNOSIS — R51 Headache: Secondary | ICD-10-CM | POA: Diagnosis present

## 2018-01-31 DIAGNOSIS — R42 Dizziness and giddiness: Secondary | ICD-10-CM | POA: Insufficient documentation

## 2018-01-31 DIAGNOSIS — G4489 Other headache syndrome: Secondary | ICD-10-CM | POA: Insufficient documentation

## 2018-01-31 HISTORY — DX: Migraine, unspecified, not intractable, without status migrainosus: G43.909

## 2018-01-31 MED ORDER — MECLIZINE HCL 12.5 MG PO TABS: 12.5000 mg | ORAL_TABLET | Freq: Three times a day (TID) | ORAL | 0 refills | Status: AC | PRN

## 2018-01-31 MED ORDER — KETOROLAC TROMETHAMINE 30 MG/ML IJ SOLN
30.0000 mg | Freq: Once | INTRAMUSCULAR | Status: AC
  Administered 2018-01-31: 30 mg via INTRAMUSCULAR
  Filled 2018-01-31: qty 1

## 2018-01-31 MED ORDER — NAPROXEN 500 MG PO TABS: 500.0000 mg | ORAL_TABLET | Freq: Two times a day (BID) | ORAL | 0 refills | Status: DC

## 2018-01-31 NOTE — ED Notes (Signed)
O2 dc'd at this time; pt sts he feels no different.

## 2018-01-31 NOTE — ED Triage Notes (Signed)
L side headache x 2 months.

## 2018-01-31 NOTE — Discharge Instructions (Signed)
Please read attached information regarding your condition. I have referred you to the neurology specialist.  They will be contacting you for an appointment. Take naproxen as needed for headaches. Return to ED for worsening symptoms, head injuries or falls, numbness in arms or legs, trouble moving her neck.

## 2018-01-31 NOTE — ED Provider Notes (Signed)
[facility] EMERGENCY DEPARTMENT Provider Note   CSN: [id number] Arrival date & time: 01/31/18  1439     History   Chief Complaint Chief Complaint  Patient presents with  . Headache    HPI Jesse Alexander is a 27 y.o. male with a past medical history of migraines, presents to ED for evaluation of left-sided headache intermittently for the past 2 months.  Reports that the pain is located behind his left eye.  He reports intermittent tearing of the left eye, tenderness of the left ear, that is triggered by bright light exposure.  Associated nausea.  He also reports vertiginous symptoms including feeling off balance.  He has suffered from the vertiginous symptoms in the past which improved with supportive measures.  He states that the vertiginous symptoms have been constant since they began 2 months ago however, the headache and the lacrimation and tinnitus are intermittent and triggered by bright lights.  Has tried Advil with mild to no improvement in his symptoms.  States this feels different than his prior headaches.  Denies any fevers, URI symptoms, neck pain, numbness in arms or legs, trouble with ambulation, vomiting.  HPI  Past Medical History:  Diagnosis Date  . GERD (gastroesophageal reflux disease)   . Migraines   . Sciatica     There are no active problems to display for this patient.   History reviewed. No pertinent surgical history.     Home Medications    Prior to Admission medications   Medication Sig Start Date End Date Taking? Authorizing Provider  amoxicillin (AMOXIL) 875 MG tablet Take 1 tablet (875 mg total) by mouth 2 (two) times daily. 03/29/17   Wallis Bamberg, PA-C  benzonatate (TESSALON) 100 MG capsule Take 1-2 capsules (100-200 mg total) by mouth 3 (three) times daily as needed. 03/26/17   Wallis Bamberg, PA-C  HYDROcodone-homatropine [facility]) 5-1.5 MG/5ML syrup Take 5 mLs by mouth at bedtime as needed. 03/26/17   Wallis Bamberg, PA-C  meclizine  (ANTIVERT) 12.5 MG tablet Take 1 tablet (12.5 mg total) by mouth 3 (three) times daily as needed for dizziness. 01/31/18   Rhesa Forsberg, PA-C  naproxen (NAPROSYN) 500 MG tablet Take 1 tablet (500 mg total) by mouth 2 (two) times daily. 01/31/18   Adonte Vanriper, PA-C  pseudoephedrine (SUDAFED 12 HOUR) 120 MG 12 hr tablet Take 1 tablet (120 mg total) by mouth 2 (two) times daily. 03/26/17   Wallis Bamberg, PA-C    Family History Family History  Problem Relation Age of Onset  . Depression Mother     Social History Social History   Tobacco Use  . Smoking status: Never Smoker  . Smokeless tobacco: Never Used  Substance Use Topics  . Alcohol use: No  . Drug use: No     Allergies   Patient has no known allergies.   Review of Systems Review of Systems  Constitutional: Negative for appetite change, chills and fever.  HENT: Positive for rhinorrhea. Negative for ear pain, sneezing and sore throat.   Eyes: Positive for discharge. Negative for photophobia and visual disturbance.  Respiratory: Negative for cough, chest tightness, shortness of breath and wheezing.   Cardiovascular: Negative for chest pain and palpitations.  Gastrointestinal: Positive for nausea. Negative for abdominal pain, blood in stool, constipation, diarrhea and vomiting.  Genitourinary: Negative for dysuria, hematuria and urgency.  Musculoskeletal: Negative for myalgias.  Skin: Negative for rash.  Neurological: Positive for headaches. Negative for dizziness, weakness and light-headedness.       +  vertigo, feeling off-balance     Physical Exam Updated Vital Signs BP 124/70 (BP Location: Right Arm)   Pulse 84   Temp 98.8 F (37.1 C) (Oral)   Resp 18   Ht 5\' 9"  (1.753 m)   Wt 95.3 kg (210 lb)   SpO2 98%   BMI 31.01 kg/m   Physical Exam  Constitutional: He is oriented to person, place, and time. He appears well-developed and well-nourished. No distress.  HENT:  Head: Normocephalic and atraumatic.  Nose: Nose  normal.  Eyes: Conjunctivae and EOM are normal. Left eye exhibits no discharge. No scleral icterus.  Neck: Normal range of motion. Neck supple.  Cardiovascular: Normal rate, regular rhythm, normal heart sounds and intact distal pulses. Exam reveals no gallop and no friction rub.  No murmur heard. Pulmonary/Chest: Effort normal and breath sounds normal. No respiratory distress.  Abdominal: Soft. Bowel sounds are normal. He exhibits no distension. There is no tenderness. There is no guarding.  Musculoskeletal: Normal range of motion. He exhibits no edema.  Neurological: He is alert and oriented to person, place, and time. No cranial nerve deficit or sensory deficit. He exhibits normal muscle tone. Coordination normal.  Pupils reactive. No facial asymmetry noted. Cranial nerves appear grossly intact. Sensation intact to light touch on face, BUE and BLE. Strength 5/5 in BUE and BLE. Normal finger to nose coordination bilaterally.  Skin: Skin is warm and dry. No rash noted.  Psychiatric: He has a normal mood and affect.  Nursing note and vitals reviewed.    ED Treatments / Results  Labs (all labs ordered are listed, but only abnormal results are displayed) Labs Reviewed - No data to display  EKG  EKG Interpretation None       Radiology Ct Head Wo Contrast  Result Date: 01/31/2018 CLINICAL DATA:  27 year old male with chronic headache. EXAM: CT HEAD WITHOUT CONTRAST TECHNIQUE: Contiguous axial images were obtained from the base of the skull through the vertex without intravenous contrast. COMPARISON:  None. FINDINGS: Brain: No evidence of acute infarction, hemorrhage, hydrocephalus, extra-axial collection or mass lesion/mass effect. Vascular: No hyperdense vessel or unexpected calcification. Skull: Normal. Negative for fracture or focal lesion. Sinuses/Orbits: No acute finding. Other: None IMPRESSION: Normal noncontrast CT of the brain. Electronically Signed   By: Elgie Collard M.D.   On:  01/31/2018 19:02    Procedures Procedures (including critical care time)  Medications Ordered in ED Medications  ketorolac (TORADOL) 30 MG/ML injection 30 mg (not administered)     Initial Impression / Assessment and Plan / ED Course  I have reviewed the triage vital signs and the nursing notes.  Pertinent labs & imaging results that were available during my care of the patient were reviewed by me and considered in my medical decision making (see chart for details).     Patient presents to ED for evaluation of left-sided headache intermittently and continuous vertiginous symptoms including feeling off balance for the past 2 months.  Reports that the pain is located behind his left eye.  He also reports eye tearing on the L side, rhinorrhea when the headache flares up.  He has a history of migraines but this feels different.  No particular triggers for the symptoms.  Denies any head injuries or falls, numbness in arms or legs, changes in gait, neck pain or fever.  Physical exam he is overall well-appearing.  He has no deficits on his neurological examination.  Due to the change in the characteristics of his headache,  CT of the head was ordered which showed no acute abnormalities or structural cause of his symptoms.  He does seem to have some component of possible cluster headache based on his age group and symptoms that he described to me.  He was placed on high flow oxygen for 15 minutes.  He tells me that his symptoms have improved "a little bit."  I did tell him that he may need prophylactic medicine for what appears to be his cluster headaches that his neurologist can possibly prescribe for him.  Patient is agreeable to this plan.  There are no headache characteristics that are lateralizing or concerning for increased ICP, infectious or vascular cause of his symptoms.   Will refer to live our neurology for further evaluation and management.  Patient given Toradol here with improvement in  symptoms.  Will discharge home with naproxen and meclizine for vertigo. Patient appears stable for d/c at this time. Strict return precautions given.  Portions of this note were generated with Scientist, clinical (histocompatibility and immunogenetics)Dragon dictation software. Dictation errors may occur despite best attempts at proofreading.   Final Clinical Impressions(s) / ED Diagnoses   Final diagnoses:  Other headache syndrome  Vertigo    ED Discharge Orders        Ordered    Ambulatory referral to Neurology    Comments:  An appointment is requested in approximately: 1 week   01/31/18 1937    naproxen (NAPROSYN) 500 MG tablet  2 times daily     01/31/18 1937    meclizine (ANTIVERT) 12.5 MG tablet  3 times daily PRN     01/31/18 1939       Dietrich PatesKhatri, Khalani Novoa, PA-C 01/31/18 1945    Tegeler, Canary Brimhristopher J, MD 01/31/18 [phone]

## 2018-02-04 ENCOUNTER — Encounter: Payer: Self-pay | Admitting: Neurology

## 2018-02-19 ENCOUNTER — Encounter: Payer: Self-pay | Admitting: Neurology

## 2018-02-19 ENCOUNTER — Ambulatory Visit: Payer: 59 | Admitting: Neurology

## 2018-02-19 VITALS — BP 120/70 | HR 83 | Resp 16 | Ht 69.0 in | Wt 212.0 lb

## 2018-02-19 DIAGNOSIS — G43119 Migraine with aura, intractable, without status migrainosus: Secondary | ICD-10-CM

## 2018-02-19 DIAGNOSIS — G43109 Migraine with aura, not intractable, without status migrainosus: Secondary | ICD-10-CM | POA: Diagnosis not present

## 2018-02-19 DIAGNOSIS — F329 Major depressive disorder, single episode, unspecified: Secondary | ICD-10-CM

## 2018-02-19 DIAGNOSIS — F32A Depression, unspecified: Secondary | ICD-10-CM

## 2018-02-19 MED ORDER — KETOROLAC TROMETHAMINE 15.75 MG/SPRAY NA SOLN: NASAL | 2 refills | Status: AC

## 2018-02-19 MED ORDER — TOPIRAMATE ER 50 MG PO CAP24: 50.0000 mg | ORAL_CAPSULE | Freq: Every day | ORAL | 2 refills | Status: DC

## 2018-02-19 NOTE — Patient Instructions (Addendum)
Migraine Recommendations: 1.  Start topiramate ER 50mg  at bedtime.  Call in 4 weeks with update and we can adjust dose if needed. Please use Co-pay to help you with medication.  2.  Take Sprix (ketorolac) nasal spray at earliest onset of headache.  I actuation in each nostril every 6 to 8 hours.  Maximum of 8 actuations in 24 hours. 3.  Limit use of pain relievers to no more than 2 days out of the week.  These medications include acetaminophen, ibuprofen, triptans and narcotics.  This will help reduce risk of rebound headaches. 4.  Be aware of common food triggers such as processed sweets, processed foods with nitrites (such as deli meat, hot dogs, sausages), foods with MSG, alcohol (such as wine), chocolate, certain cheeses, certain fruits (dried fruits, bananas, some citrus fruit), vinegar, diet soda. 4.  Avoid caffeine 5.  Routine exercise 6.  Proper sleep hygiene 7.  Stay adequately hydrated with water 8.  Keep a headache diary. 9.  Maintain proper stress management. 10.  Do not skip meals. 11.  Consider supplements:  Magnesium citrate 400mg  to 600mg  daily, riboflavin 400mg , Coenzyme Q 10 100mg  three times daily 12.  As these are complicated migraines, I want to check MRI of brain and MRA of head. We have [facility] and they will call you directly to schedule your appt. They are located at [address] If you need to contact them directly please call [phone].  13.  Follow up in 3 months. 14. [facility]. Please contact office to schedule appt. Ph: [phone]   Recurrent Migraine Headache A migraine headache is very bad, throbbing pain that is usually on one side of your head. Recurrent migraines keep coming back (recurring). Talk with your doctor about what things may bring on (trigger) your migraine headaches. Follow these instructions at home: Medicines  Take over-the-counter and prescription medicines only  as told by your doctor.  Do not drive or use heavy machinery while taking prescription pain medicine. Lifestyle  Do not use any products that contain nicotine or tobacco, such as cigarettes and e-cigarettes. If you need help quitting, ask your doctor.  Limit alcohol intake to no more than 1 drink a day for nonpregnant women and 2 drinks a day for men. One drink equals 12 oz of beer, 5 oz of wine, or 1 oz of hard liquor.  Get 7-9 hours of sleep each night.  Lessen any stress in your life. Ask your doctor about ways to lower your stress.  Stay at a healthy weight. Talk with your doctor if you need help losing weight.  Get regular exercise. General instructions  Keep a journal to find out if certain things bring on migraine headaches. For example, write down: ? What you eat and drink. ? How much sleep you get. ? Any change to your diet or medicines.  Lie down in a dark, quiet room when you have a migraine.  Try placing a cool towel over your head when you have a migraine.  Keep lights dim if bright lights bother you or make your migraines worse.  Keep all follow-up visits as told by your doctor. This is important. Contact a doctor if:  Medicine does not help your migraines.  Your pain keeps coming back.  You have a fever.  You have weight loss without trying. Get help right away if:  Your migraine becomes really bad and medicine  does not help.  You have a stiff neck.  You have trouble seeing.  Your muscles are weak or you lose control of your muscles.  You lose your balance or have trouble walking.  You feel like you will pass out (faint) or you pass out.  You have really bad symptoms that are different than your first symptoms.  You start having sudden, very bad headaches that last for one second or less, like a thunderclap. Summary  A migraine headache is very bad, throbbing pain that is usually on one side of your head.  Talk with your doctor about what  things may bring on (trigger) your migraine headaches.  Take over-the-counter and prescription medicines only as told by your doctor.  Lie down in a dark, quiet room when you have a migraine.  Keep a journal about what you eat and drink, how much sleep you get, and any changes to your medicines. This can help you find out if certain things make you have migraine headaches. This information is not intended to replace advice given to you by your health care provider. Make sure you discuss any questions you have with your health care provider. Document Released: 08/15/2008 Document Revised: 09/29/2016 Document Reviewed: 09/29/2016

## 2018-02-19 NOTE — Progress Notes (Signed)
NEUROLOGY CONSULTATION NOTE  Jesse Alexander MRN: [id number] DOB: 01/19/1991  Referring provider: ED referral Primary care provider: no PCP  Reason for consult:  migraines  HISTORY OF PRESENT ILLNESS: Jesse Alexander is a 27 year old male with migraines who presents for headaches.  History supplemented by ED note.  Onset:  27 years old Location:  left-sided/left retro-orbital/parietal Quality:  Pressure. Intensity:  Severe.  He denies new headache, thunderclap headache or severe headache that wakes him from sleep. Aura:  Cloudy vision closing in Prodrome:  no Postdrome:  fatigue Associated symptoms:  left eye lacrimation, rhinorrhea, tinnitus, photophobia, phonophobia, osmophobia, positional dizziness, double vision and right sided upper and lower extremity weakness. Duration:  2-3 days Frequency:  Every 2 to 3 months Frequency of abortive medication: as needed Triggers/exacerbating factors:  bright light, quickly moving head or focusing on something aggravates vertigo Relieving factors:  Rest Activity:  aggravates  He went to the ED on 01/31/18 as the vertiginous symptoms were persistent. CT of head was personally reviewed and was normal.  Symptoms have lasted a month and still with dull nagging dizziness.  He had a similar episode of persistent dizziness last year, lasting a month.  Current NSAIDS:  Advil (ineffective) Current analgesics:  no Current triptans:  no Current anti-emetic:  no Current muscle relaxants:  no Current anti-anxiolytic:  no Current sleep aide:  no Current Antihypertensive medications:  no Current Antidepressant medications:  no Current Anticonvulsant medications:  no Current Vitamins/Herbal/Supplements:  no Current Antihistamines/Decongestants:  no Other therapy:  no Other medication:  meclizine (for dizziness)  Past NSAIDS:  Naproxen 500mg  (effective but caused drowsiness) Past analgesics:  Excedrin Past abortive triptans:  no Past muscle  relaxants:  no Past anti-emetic:  no Past antihypertensive medications:  no Past antidepressant medications:  no Past anticonvulsant medications:  no Past vitamins/Herbal/Supplements:  no Past antihistamines/decongestants:  no Other past therapies:  no  Caffeine:  Coffee every morning Alcohol:  occasional Smoker:  Marijuana.  Not tobacco Diet:  Needs to increase water Exercise:  Trying to increase Depression:  yes; Anxiety:  yes Other pain:  Back pain Sleep hygiene:  good Family history of headache:  mother  PAST MEDICAL HISTORY: Past Medical History:  Diagnosis Date  . GERD (gastroesophageal reflux disease)   . Migraines   . Sciatica     PAST SURGICAL HISTORY: History reviewed. No pertinent surgical history.  MEDICATIONS: Current Outpatient Medications on File Prior to Visit  Medication Sig Dispense Refill  . meclizine (ANTIVERT) 12.5 MG tablet Take 1 tablet (12.5 mg total) by mouth 3 (three) times daily as needed for dizziness. 30 tablet 0   No current facility-administered medications on file prior to visit.     ALLERGIES: No Known Allergies  FAMILY HISTORY: Family History  Problem Relation Age of Onset  . Depression Mother     SOCIAL HISTORY: Social History   Socioeconomic History  . Marital status: Single    Spouse name: Not on file  . Number of children: Not on file  . Years of education: Not on file  . Highest education level: Not on file  Occupational History  . Not on file  Social Needs  . Financial resource strain: Not on file  . Food insecurity:    Worry: Not on file    Inability: Not on file  . Transportation needs:    Medical: Not on file    Non-medical: Not on file  Tobacco Use  . Smoking status: Never  Smoker  . Smokeless tobacco: Never Used  Substance and Sexual Activity  . Alcohol use: No  . Drug use: No  . Sexual activity: Not on file  Lifestyle  . Physical activity:    Days per week: Not on file    Minutes per session: Not  on file  . Stress: Not on file  Relationships  . Social connections:    Talks on phone: Not on file    Gets together: Not on file    Attends religious service: Not on file    Active member of club or organization: Not on file    Attends meetings of clubs or organizations: Not on file    Relationship status: Not on file  . Intimate partner violence:    Fear of current or ex partner: Not on file    Emotionally abused: Not on file    Physically abused: Not on file    Forced sexual activity: Not on file  Other Topics Concern  . Not on file  Social History Narrative  . Not on file    REVIEW OF SYSTEMS: Constitutional: No fevers, chills, or sweats, no generalized fatigue, change in appetite Eyes: No visual changes, double vision, eye pain Ear, nose and throat: No hearing loss, ear pain, nasal congestion, sore throat Cardiovascular: No chest pain, palpitations Respiratory:  No shortness of breath at rest or with exertion, wheezes GastrointestinaI: No nausea, vomiting, diarrhea, abdominal pain, fecal incontinence Genitourinary:  No dysuria, urinary retention or frequency Musculoskeletal:  No neck pain, back pain Integumentary: No rash, pruritus, skin lesions Neurological: as above Psychiatric: No depression, insomnia, anxiety Endocrine: No palpitations, fatigue, diaphoresis, mood swings, change in appetite, change in weight, increased thirst Hematologic/Lymphatic:  No purpura, petechiae. Allergic/Immunologic: no itchy/runny eyes, nasal congestion, recent allergic reactions, rashes  PHYSICAL EXAM: Vitals:   02/19/18 0758  BP: 120/70  Pulse: 83  Resp: 16  SpO2: 98%   General: No acute distress.  Patient appears well-groomed.  Head:  Normocephalic/atraumatic Eyes:  fundi examined but not visualized Neck: supple, no paraspinal tenderness, full range of motion Back: No paraspinal tenderness Heart: regular rate and rhythm Lungs: Clear to auscultation bilaterally. Vascular: No  carotid bruits. Neurological Exam: Mental status: alert and oriented to person, place, and time, recent and remote memory intact, fund of knowledge intact, attention and concentration intact, speech fluent and not dysarthric, language intact. Cranial nerves: CN I: not tested CN II: pupils equal, round and reactive to light, visual fields intact CN III, IV, VI:  full range of motion, no nystagmus, no ptosis CN V: facial sensation intact CN VII: upper and lower face symmetric CN VIII: hearing intact CN IX, X: gag intact, uvula midline CN XI: sternocleidomastoid and trapezius muscles intact CN XII: tongue midline Bulk & Tone: normal, no fasciculations. Motor:  5/5 throughout  Sensation: temperature and vibration sensation intact. Deep Tendon Reflexes:  2+ throughout, toes downgoing.  Finger to nose testing:  Without dysmetria.  Heel to shin:  Without dysmetria.  Gait:  Normal station and stride.  Able to turn and tandem walk. Romberg negative.  IMPRESSION: 1.  Migraine with aura/basilar migraine (given the associated double vision and subjective right sided weakness with the vertigo) 2.  Depression and anxiety.  PLAN: 1.  Start topiramate ER 50mg  at bedtime.  Side effects discussed. 2.  Triptans are contraindicated.  Will try Sprix (ketorolac) nasal spray for abortive therapy.   3.  Exercise/hydration/diet 4.  Limit pain relievers to no more than 2  days out of week to prevent rebound headache 5.  Caffeine cessation 6.  Headache diary 6.  Consider Mg, B2, CoQ10 8.  As these are lateralizing prolonged symptoms, will check MRI and MRA of head to evaluate for vertebrobasilar insufficiency. 9.  [facility] and [facility].  10.  Follow up in 3 months.  Thank you for allowing me to take part in the care of this patient.  Shon MilletAdam Jaffe, DO

## 2018-02-21 NOTE — Progress Notes (Signed)
Rcvd fax from [facility]. Sprix not covered.Initiated PA on cover my meds KEY: [id number] BIN: [id number] PCN: [id number] RxGRP: [id number]

## 2018-03-09 ENCOUNTER — Ambulatory Visit
Admission: RE | Admit: 2018-03-09 | Discharge: 2018-03-09 | Disposition: A | Discharge status: Home / Self Care | Payer: 59 | Source: Ambulatory Visit | Attending: Neurology | Admitting: Neurology

## 2018-03-09 DIAGNOSIS — G43109 Migraine with aura, not intractable, without status migrainosus: Secondary | ICD-10-CM

## 2018-03-15 ENCOUNTER — Telehealth: Payer: Self-pay

## 2018-03-15 NOTE — Telephone Encounter (Signed)
-----   Message from Drema DallasAdam R Jaffe, DO sent at 03/11/2018  6:50 AM EDT ----- MRI of brain looks okay.  It reveals very few and tiny spots on the brain which are nonspecific and do not look concerning.  Often they are seen in people with migraines but are not concerning.  Blood vessels look okay

## 2018-03-15 NOTE — Telephone Encounter (Signed)
Called and spoke with Pt. Advsd him of MRI results. Pt states he has not rcvd Sprix. Advsd him I will call [facility] and call him back. [facility] HowardLynette. They have attempted to reach Pt, have left a VM with no response. I advsd her I will have Pt call them. Called Pt, provided the telephone number for [employer]. He will call them now.

## 2018-03-18 ENCOUNTER — Encounter: Payer: Self-pay | Admitting: Neurology

## 2018-04-04 ENCOUNTER — Ambulatory Visit (HOSPITAL_COMMUNITY): Payer: 59 | Admitting: Psychiatry

## 2018-04-09 ENCOUNTER — Other Ambulatory Visit: Payer: Self-pay | Admitting: Neurology

## 2018-05-16 ENCOUNTER — Ambulatory Visit: Payer: 59 | Admitting: Neurology

## 2018-05-16 ENCOUNTER — Encounter

## 2018-05-30 ENCOUNTER — Ambulatory Visit: Payer: 59 | Admitting: Neurology

## 2019-11-29 IMAGING — MR MR MRA HEAD W/O CM
8 series · 48 of 48 positions shown · non-contrast
Comparison: 01/31/2018 CT head

ADDENDUM:
Review of additional submitted sequence TOF 3D multi slab. No change
in findings.

By: Yomary Yu M.D.
CLINICAL DATA: 26 y/o M; 4 months of dizziness and left side of
head headaches with blurred vision and pain behind the left eye.
EXAM:
MRI HEAD WITHOUT CONTRAST
MRA HEAD WITHOUT CONTRAST
TECHNIQUE: Multiplanar, multiecho pulse sequences of the brain and surrounding
structures were obtained without intravenous contrast. Angiographic
images of the head were obtained using MRA technique without
contrast.

[Series 1: T1 · sagittal · 5.0mm · 0.45mm/px · 3 of 22 slices shown]
[im 1/22]
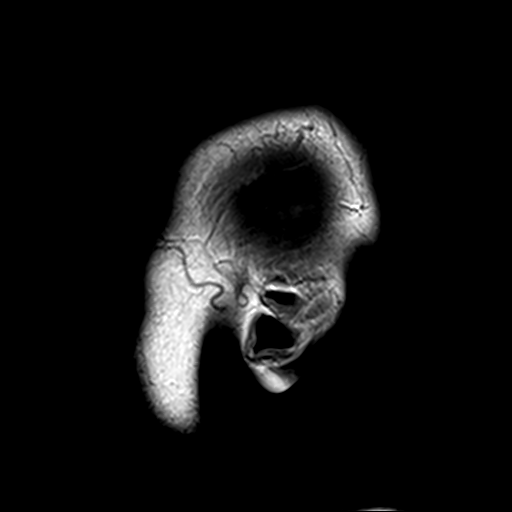
[im 11/22]
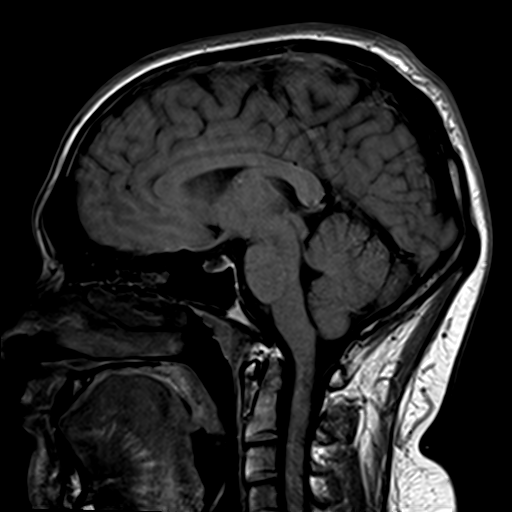
[im 22/22]
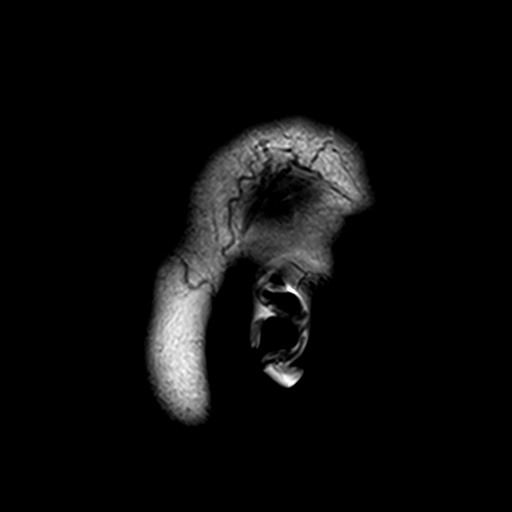

[Series 2: DWI · axial · 3.0mm · 1.80mm/px · z∈[-108,+39]mm · 11 of 100 slices shown (1 of 2)]
[im 1/100]
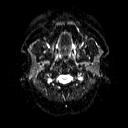
[im 10/100]
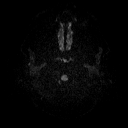
[im 20/100]
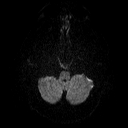
[im 30/100]
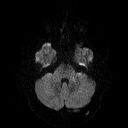
[im 40/100]
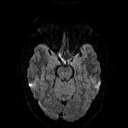
[im 50/100]
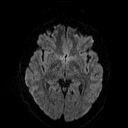
[im 60/100]
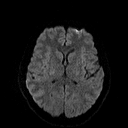
[im 70/100]
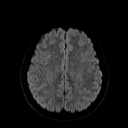
[im 80/100]
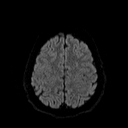
[im 90/100]
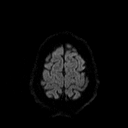
[im 100/100]
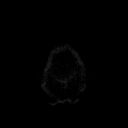

[Series 3: DWI · axial · 3.0mm · 1.80mm/px · z∈[-108,+39]mm · 6 of 50 slices shown (2 of 2)]
[im 1/50]
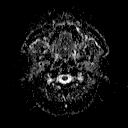
[im 10/50]
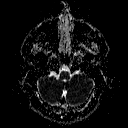
[im 20/50]
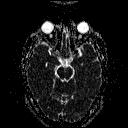
[im 30/50]
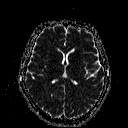
[im 40/50]
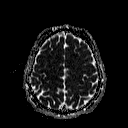
[im 50/50]
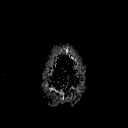

[Series 4: T2 · axial · 5.0mm · 0.51mm/px · z∈[-107,+39]mm · 2 of 22 slices shown (1 of 2)]
[im 1/22]
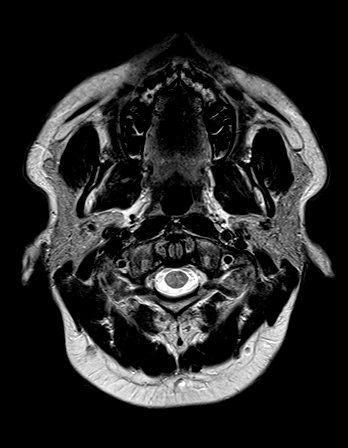
[im 22/22]
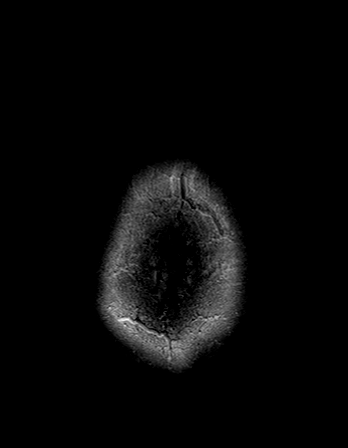

[Series 5: FLAIR · axial · 3.0mm · 0.45mm/px · z∈[-109,+39]mm · 4 of 33 slices shown]
[im 1/33]
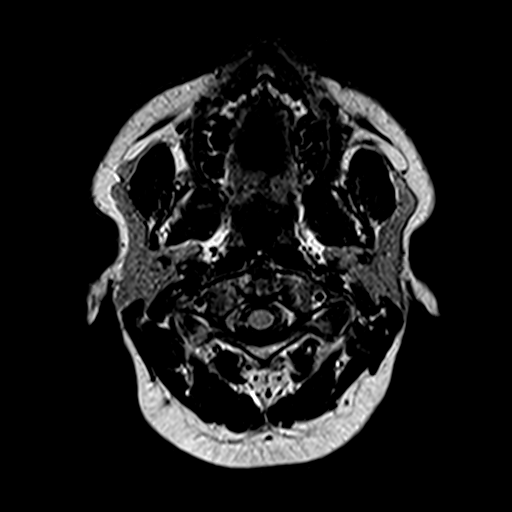
[im 11/33]
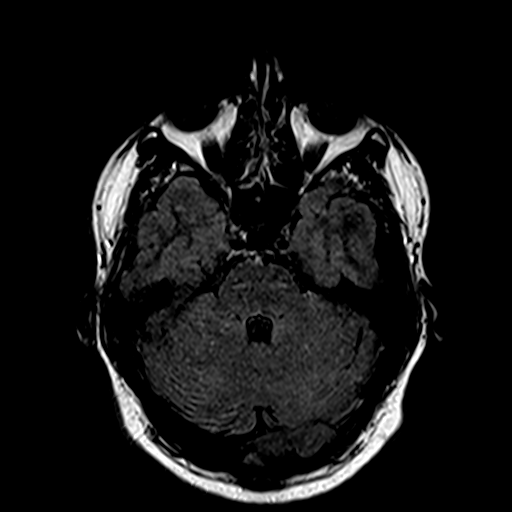
[im 22/33]
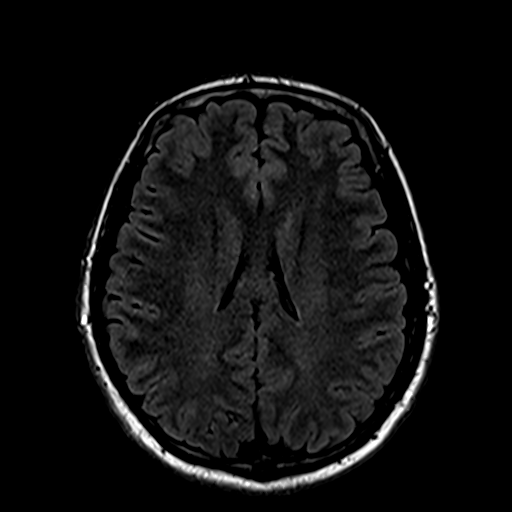
[im 33/33]
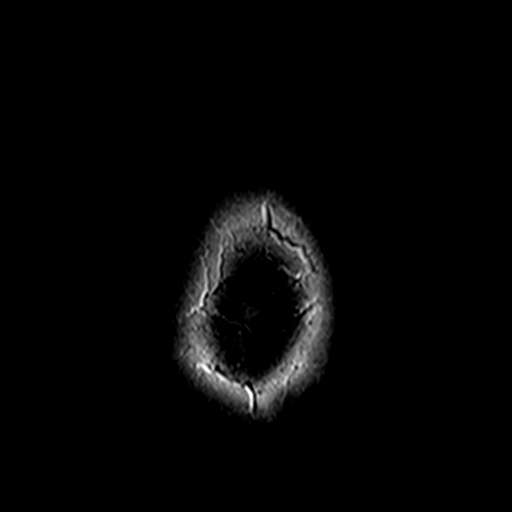

[Series 7: swi_images · axial · 5.0mm · 0.90mm/px · z∈[-107,+38]mm · 3 of 30 slices shown]
[im 1/30]
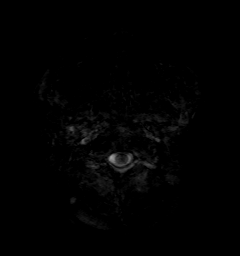
[im 15/30]
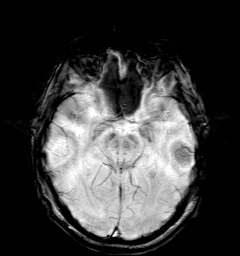
[im 30/30]
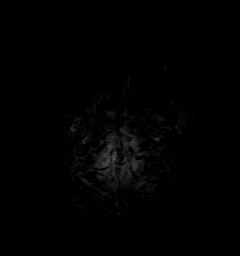

[Series 8: t1_mpr_tra · axial · 1.0mm · 0.71mm/px · z∈[-105,+38]mm · 16 of 144 slices shown]
[im 1/144]
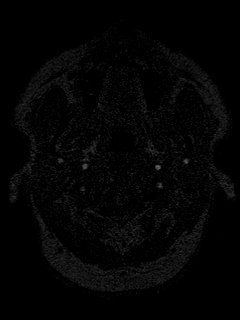
[im 10/144]
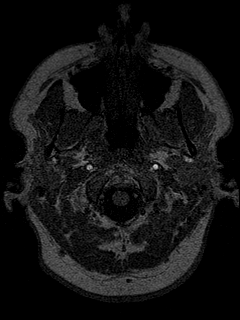
[im 20/144]
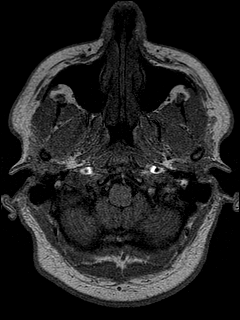
[im 29/144]
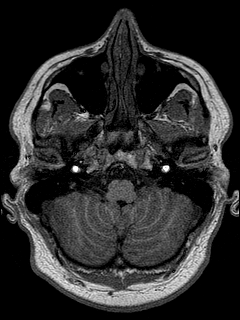
[im 39/144]
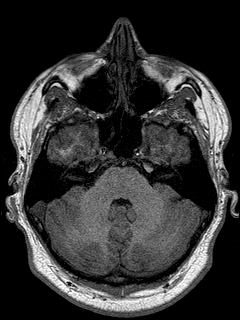
[im 48/144]
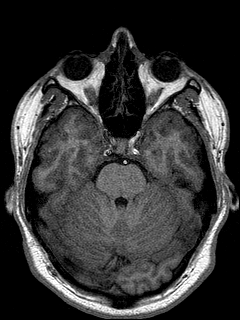
[im 58/144]
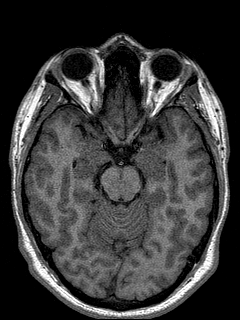
[im 67/144]
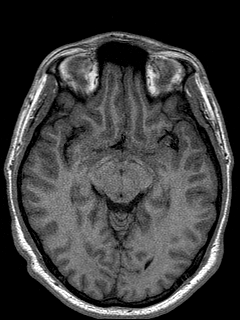
[im 77/144]
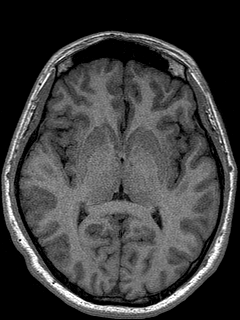
[im 86/144]
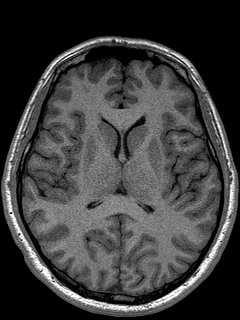
[im 96/144]
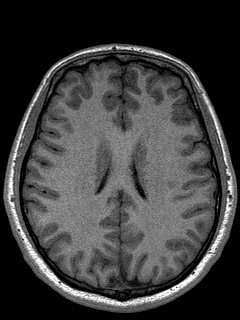
[im 105/144]
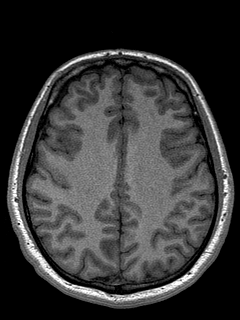
[im 115/144]
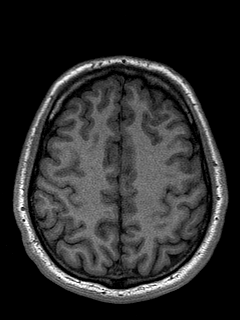
[im 124/144]
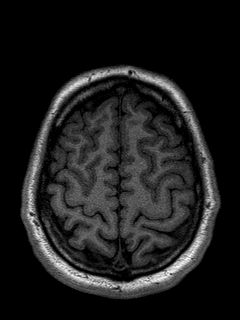
[im 134/144]
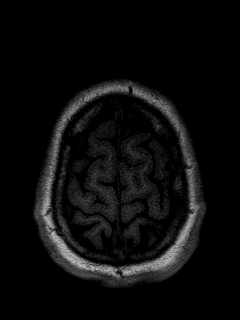
[im 144/144]
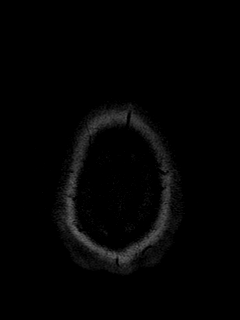

[Series 9: T2 · coronal · 5.0mm · 0.45mm/px · 3 of 28 slices shown (2 of 2)]
[im 1/28]
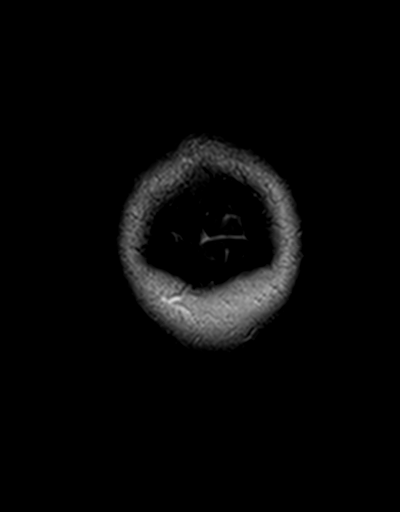
[im 14/28]
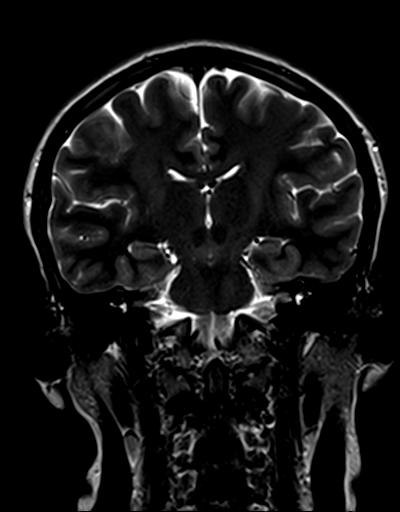
[im 28/28]
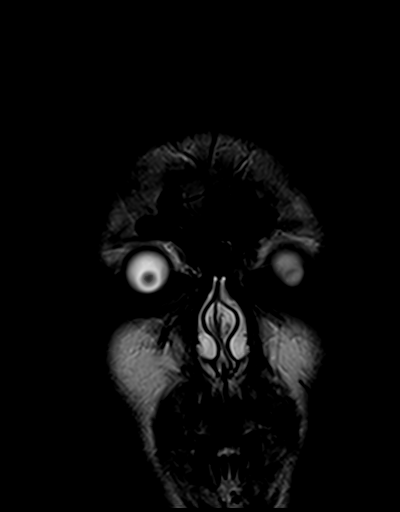

[48 of 48 positions shown; findings below may reference images not displayed]

FINDINGS: MRI HEAD FINDINGS

Brain: No acute infarction, hemorrhage, hydrocephalus, extra-axial
collection or mass lesion. Few punctate foci of T2 FLAIR
hyperintensity are present in the left frontal subcortical white
matter. No lesion is present within periventricular white matter,
corpus callosum, basal ganglia, or posterior fossa.

Vascular: Normal flow voids.

Skull and upper cervical spine: Normal marrow signal.

Sinuses/Orbits: Negative.

Other: None.

MRA HEAD FINDINGS

Internal carotid arteries:  Patent.

Anterior cerebral arteries:  Patent.

Middle cerebral arteries: Patent.

Anterior communicating artery: Patent.

Posterior communicating arteries:  Patent.

Posterior cerebral arteries:  Patent.

Basilar artery:  Patent.

Vertebral arteries:  Patent.

No evidence of high-grade stenosis, large vessel occlusion, or
aneurysm identified.
IMPRESSION: 1. Few punctate foci of T2 FLAIR hyperintensity in left frontal
subcortical white matter, probably related to history of migraine
headache. No specific findings of demyelination.
2. Otherwise unremarkable MRI of the brain.
3. Normal MRA of the head.

By: Yomary Yu M.D.

## 2020-03-04 ENCOUNTER — Ambulatory Visit: Payer: Medicaid Other | Attending: Internal Medicine

## 2020-03-04 DIAGNOSIS — Z23 Encounter for immunization: Secondary | ICD-10-CM

## 2020-03-04 NOTE — Progress Notes (Signed)
   Covid-19 Vaccination Clinic  Name:  Jesse Alexander    MRN: [id number] DOB: 07-07-1991  03/04/2020  Mr. [name] was observed post Covid-19 immunization for 15 minutes without incident. He was provided with Vaccine Information Sheet and instruction to access the V-Safe system.   Mr. [name] was instructed to call 911 with any severe reactions post vaccine: [name] Kitchen Difficulty breathing  . Swelling of face and throat  . A fast heartbeat  . A bad rash all over body  . Dizziness and weakness   Immunizations Administered    Name Date Dose VIS Date Route   Pfizer COVID-19 Vaccine 03/04/2020 12:13 PM 0.3 mL 10/31/2019 Intramuscular   Manufacturer: [employer], [employer]   Lot: [id number]   NDC: [address]-9

## 2020-03-22 ENCOUNTER — Ambulatory Visit: Payer: Medicaid Other | Attending: Internal Medicine

## 2020-03-22 DIAGNOSIS — Z23 Encounter for immunization: Secondary | ICD-10-CM

## 2020-03-22 NOTE — Progress Notes (Signed)
   Covid-19 Vaccination Clinic  Name:  Jesse Alexander    MRN: [id number] DOB: [birth year]  03/22/2020  [name] was observed post Covid-19 immunization for 15 minutes without incident. He was provided with Vaccine Information Sheet and instruction to access the V-Safe system.   [name] was instructed to call 911 with any severe reactions post vaccine: [name] Kitchen Difficulty breathing  . Swelling of face and throat  . A fast heartbeat  . A bad rash all over body  . Dizziness and weakness   Immunizations Administered    Name Date Dose VIS Date Route   Pfizer COVID-19 Vaccine 03/22/2020  2:45 PM 0.3 mL 01/14/2019 Intramuscular   Manufacturer: [employer], [employer]   Lot: [id number]   NDC: [address]-4

## 2023-06-18 ENCOUNTER — Emergency Department (HOSPITAL_BASED_OUTPATIENT_CLINIC_OR_DEPARTMENT_OTHER): Payer: Commercial Managed Care - PPO

## 2023-06-18 ENCOUNTER — Emergency Department (HOSPITAL_BASED_OUTPATIENT_CLINIC_OR_DEPARTMENT_OTHER)
Admission: EM | Admit: 2023-06-18 | Discharge: 2023-06-18 | Disposition: A | Discharge status: Home / Self Care | Payer: Commercial Managed Care - PPO | Attending: Emergency Medicine | Admitting: Emergency Medicine

## 2023-06-18 ENCOUNTER — Encounter (HOSPITAL_BASED_OUTPATIENT_CLINIC_OR_DEPARTMENT_OTHER): Payer: Self-pay | Admitting: Urology

## 2023-06-18 DIAGNOSIS — N509 Disorder of male genital organs, unspecified: Secondary | ICD-10-CM | POA: Insufficient documentation

## 2023-06-18 DIAGNOSIS — N5089 Other specified disorders of the male genital organs: Secondary | ICD-10-CM

## 2023-06-18 HISTORY — DX: Herpesviral infection, unspecified: B00.9

## 2023-06-18 LAB — URINALYSIS, ROUTINE W REFLEX MICROSCOPIC
Bilirubin Urine: NEGATIVE
Glucose, UA: NEGATIVE mg/dL
Hgb urine dipstick: NEGATIVE
Ketones, ur: NEGATIVE mg/dL
Leukocytes,Ua: NEGATIVE
Nitrite: NEGATIVE
Protein, ur: NEGATIVE mg/dL
Specific Gravity, Urine: 1.02 (ref 1.005–1.030)
pH: 8 (ref 5.0–8.0)

## 2023-06-18 NOTE — ED Provider Notes (Signed)
[place] [facility] Provider Note   CSN: [id number] Arrival date & time: 06/18/23  1056     History  Chief Complaint  Patient presents with   Testicle Pain    Jesse Alexander is a 32 y.o. male.  32 year old male with a history of HSV who presents emergency department in 1 month of testicular masses.  Patient notices that over the past month he has had swelling of his testicles both on the top and bottom bilaterally.  Says it is worsened when he comes sexually aroused.  No penile discharge, rashes, or dysuria.  No trauma.  No fevers.  No recent unprotected sex.  No concern for STIs at this time.         Home Medications Prior to Admission medications   Medication Sig Start Date End Date Taking? Authorizing Provider  Ketorolac Tromethamine (SPRIX) 15.75 MG/SPRAY SOLN 1 actuation in each nostril every 6 to 8 hours, maximum of 8 actuations in 24 hours. 02/19/18   Drema Dallas, DO  meclizine (ANTIVERT) 12.5 MG tablet Take 1 tablet (12.5 mg total) by mouth 3 (three) times daily as needed for dizziness. 01/31/18   Khatri, Hillary Bow, PA-C  TROKENDI XR 50 MG CP24 TAKE 1 CAPSULE BY MOUTH EVERYDAY AT BEDTIME 04/09/18   Drema Dallas, DO      Allergies    Patient has no known allergies.    Review of Systems   Review of Systems  Physical Exam Updated Vital Signs BP 132/71 (BP Location: Right Arm)   Pulse 82   Resp 18   Ht 5\' 9"  (1.753 m)   Wt 108 kg   SpO2 97%   BMI 35.15 kg/m  Physical Exam Vitals and nursing note reviewed.  Constitutional:      General: He is not in acute distress.    Appearance: He is well-developed.  HENT:     Head: Normocephalic and atraumatic.     Right Ear: External ear normal.     Left Ear: External ear normal.     Nose: Nose normal.  Eyes:     Extraocular Movements: Extraocular movements intact.     Conjunctiva/sclera: Conjunctivae normal.     Pupils: Pupils are equal, round, and reactive to light.  Pulmonary:      Effort: Pulmonary effort is normal. No respiratory distress.  Genitourinary:    Testes: Normal.     Comments: No masses and testes palpated.  No rashes. Musculoskeletal:     Cervical back: Normal range of motion and neck supple.     Right lower leg: No edema.     Left lower leg: No edema.  Skin:    General: Skin is warm and dry.  Neurological:     Mental Status: He is alert. Mental status is at baseline.  Psychiatric:        Mood and Affect: Mood normal.        Behavior: Behavior normal.     ED Results / Procedures / Treatments   Labs (all labs ordered are listed, but only abnormal results are displayed) Labs Reviewed  URINALYSIS, ROUTINE W REFLEX MICROSCOPIC  GC/CHLAMYDIA PROBE AMP ([place]) NOT AT [facility]    EKG None  Radiology US SCROTUM W/DOPPLER  Result Date: 06/18/2023 CLINICAL DATA:  Palpable "lumps on top of testicles bilaterally" EXAM: SCROTAL ULTRASOUND DOPPLER ULTRASOUND OF THE TESTICLES TECHNIQUE: Complete ultrasound examination of the testicles, epididymis, and other scrotal structures was performed. Color and spectral Doppler ultrasound  were also utilized to evaluate blood flow to the testicles. COMPARISON:  None Available. FINDINGS: Right testicle Measurements: 4.1 x 2.6 x 2.8 cm. No mass or microlithiasis visualized. Left testicle Measurements: 4.6 x 2.5 x 3.0 cm. No mass or microlithiasis visualized. Right epididymis:  Normal in size and appearance. Left epididymis:  Normal in size and appearance. Hydrocele:  None visualized. Varicocele:  None visualized. Pulsed Doppler interrogation of both testes demonstrates normal low resistance arterial and venous waveforms bilaterally. IMPRESSION: Unremarkable exam. No evidence for testicular mass or torsion. No evidence for epididymal mass. No hydrocele. Electronically Signed   By: Kennith Center M.D.   On: 06/18/2023 12:31    Procedures Procedures    Medications Ordered in ED Medications - No data to display  ED  Course/ Medical Decision Making/ A&P Clinical Course as of 06/18/23 1603  Mon Jun 18, 2023  1240 US SCROTUM W/DOPPLER Unremarkable exam [RP]    Clinical Course User Index [RP] Rondel Baton, MD                             Medical Decision Making Amount and/or Complexity of Data Reviewed Labs: ordered. Radiology: ordered. Decision-making details documented in ED Course.   TEGEN LANDAU is a 32 y.o. male with comorbidities that complicate the patient evaluation including HSV who presents emergency department with 1 month of testicular masses  Initial Ddx:  Malignancy, epididymitis, hydrocele, varicocele  MDM/Course:  Patient presents to the emergency department with 1 month of intermittent swelling of the testicles especially when he has an erection.  No obvious signs of a sexually transmitted infection at this time.  No masses were palpated on exam.  Did have an ultrasound as well as urinalysis that were both unremarkable.  Will have the patient follow-up with urology regarding his intermittent swelling but do not feel that there is any life-threatening etiology at play at this time.  Also feel that infection is unlikely at this time given her workup and history.  This patient presents to the ED for concern of complaints listed in HPI, this involves an extensive number of treatment options, and is a complaint that carries with it a high risk of complications and morbidity. Disposition including potential need for admission considered.   Dispo: DC Home. Return precautions discussed including, but not limited to, those listed in the AVS. Allowed pt time to ask questions which were answered fully prior to dc.  The following labs were independently interpreted: Urinalysis and show no acute abnormality I have reviewed the patients home medications and made adjustments as needed       Final Clinical Impression(s) / ED Diagnoses Final diagnoses:  Testicular mass    Rx / DC  Orders ED Discharge Orders     None         Rondel Baton, MD 06/18/23 [phone]

## 2023-06-18 NOTE — ED Triage Notes (Signed)
Pt states felt nudules to bilateral testicles x 1 month  Denies pain but states discomfort at time  States nodules become hard when sexually aroused but are softer when not

## 2023-06-18 NOTE — Discharge Instructions (Addendum)
You were seen for testicular swelling in the emergency department.  Your ultrasound did not show any masses or other concerning findings.  At home, please wear compression underwear.  Check your MyChart for the results of your gonorrhea and Chlamydia tests in several days.  Follow-up with urology to discuss your testicular swelling.  Return immediately to the emergency department if you experience any of the following: Worsening pain, or any other concerning symptoms.    Thank you for visiting our Emergency Department. It was a pleasure taking care of you today.

## 2024-05-01 ENCOUNTER — Other Ambulatory Visit: Payer: Self-pay

## 2024-05-01 ENCOUNTER — Emergency Department (HOSPITAL_COMMUNITY)

## 2024-05-01 ENCOUNTER — Emergency Department (HOSPITAL_COMMUNITY)
Admission: EM | Admit: 2024-05-01 | Discharge: 2024-05-01 | Disposition: A | Discharge status: Home / Self Care | Attending: Emergency Medicine | Admitting: Emergency Medicine

## 2024-05-01 DIAGNOSIS — S6991XA Unspecified injury of right wrist, hand and finger(s), initial encounter: Secondary | ICD-10-CM | POA: Diagnosis present

## 2024-05-01 DIAGNOSIS — S62310A Displaced fracture of base of second metacarpal bone, right hand, initial encounter for closed fracture: Secondary | ICD-10-CM | POA: Insufficient documentation

## 2024-05-01 DIAGNOSIS — W1830XA Fall on same level, unspecified, initial encounter: Secondary | ICD-10-CM | POA: Insufficient documentation

## 2024-05-01 MED ORDER — OXYCODONE-ACETAMINOPHEN 5-325 MG PO TABS: 1.0000 | ORAL_TABLET | Freq: Four times a day (QID) | ORAL | 0 refills | Status: AC | PRN

## 2024-05-01 MED ORDER — KETOROLAC TROMETHAMINE 15 MG/ML IJ SOLN
15.0000 mg | Freq: Once | INTRAMUSCULAR | Status: AC
  Administered 2024-05-01: 15 mg via INTRAMUSCULAR
  Filled 2024-05-01: qty 1

## 2024-05-01 NOTE — ED Notes (Signed)
 Pt prefers non-opioid pain medicine please

## 2024-05-01 NOTE — ED Provider Notes (Signed)
 [facility] AT [facility] Provider Note   CSN: [id number] Arrival date & time: 05/01/24  1404     Patient presents with: Fall (Pt arrives via POV today for R wrist/hand injury. Pt fell today in kitchen, no LOC/thinners/headstrike. Pts hand is tingling, is reddened with some swelling on top of knuckles )  Jesse Alexander is a 33 y.o. male.    Fall  Patient is a 33 year old male presents ED today with his mother after a mechanical fall today complaining of right dorsal hand pain, wrist pain.  Noted that he tripped when in the kitchen and landed with a closed fist on the ground with his right fist.  Since then has noted pain over the metacarpal bone of the index finger on his right hand.  Since then has noted swelling and tingling to the dorsal aspect of his right hand.  Denies head injury, LOC, elbow pain, shoulder pain, lower extremity injury, chest pain, neck pain.  Denies numbness, weakness.      Prior to Admission medications   Medication Sig Start Date End Date Taking? Authorizing Provider  oxyCODONE-acetaminophen  (PERCOCET/ROXICET) 5-325 MG tablet Take 1 tablet by mouth every 6 (six) hours as needed for severe pain (pain score 7-10). 05/01/24  Yes Reyansh Kushnir S, PA-C  Ketorolac  Tromethamine  (SPRIX ) 15.75 MG/SPRAY SOLN 1 actuation in each nostril every 6 to 8 hours, maximum of 8 actuations in 24 hours. 02/19/18   Festus Hubert, Adam R, DO  meclizine  (ANTIVERT ) 12.5 MG tablet Take 1 tablet (12.5 mg total) by mouth 3 (three) times daily as needed for dizziness. 01/31/18   Corena Devon, PA-C  TROKENDI  XR 50 MG CP24 TAKE 1 CAPSULE BY MOUTH EVERYDAY AT BEDTIME 04/09/18   Merriam Abbey, DO    Allergies: Patient has no known allergies.    Review of Systems  Musculoskeletal:  Positive for arthralgias.  All other systems reviewed and are negative.   Updated Vital Signs BP 118/73 (BP Location: Left Arm)   Pulse 94   Temp 98.1 F (36.7 C) (Oral)   Resp (!) 22    Ht 5' 9 (1.753 m)   Wt 90.7 kg   SpO2 99%   BMI 29.53 kg/m   Physical Exam Vitals and nursing note reviewed.  Constitutional:      General: He is not in acute distress.    Appearance: Normal appearance. He is not ill-appearing or diaphoretic.  HENT:     Head: Normocephalic and atraumatic.   Eyes:     General: No scleral icterus.       Right eye: No discharge.        Left eye: No discharge.     Extraocular Movements: Extraocular movements intact.     Conjunctiva/sclera: Conjunctivae normal.    Cardiovascular:     Rate and Rhythm: Normal rate and regular rhythm.     Pulses: Normal pulses.     Heart sounds: Normal heart sounds. No murmur heard.    No friction rub. No gallop.  Pulmonary:     Effort: Pulmonary effort is normal. No respiratory distress.     Breath sounds: Normal breath sounds.  Abdominal:     General: Abdomen is flat.     Palpations: Abdomen is soft.     Tenderness: There is no abdominal tenderness.   Musculoskeletal:        General: Swelling, tenderness, deformity and signs of injury present.     Cervical back: Normal range of motion and neck  supple. No rigidity or tenderness.     Comments: Notable swelling, deformity noted to the dorsal aspect of his right hand over the second metacarpal.  Good cap refill, good radial pulse, has intact ROM in all digits, limited secondary to pain.  Good sensation across all digits as well.  No ecchymosis noted.   Skin:    General: Skin is warm and dry.     Capillary Refill: Capillary refill takes less than 2 seconds.     Findings: No bruising or erythema.   Neurological:     General: No focal deficit present.     Mental Status: He is alert and oriented to person, place, and time. Mental status is at baseline.     Sensory: No sensory deficit.   Psychiatric:        Mood and Affect: Mood normal.     (all labs ordered are listed, but only abnormal results are displayed) Labs Reviewed - No data to  display  EKG: None  Radiology: DG Hand Complete Right Result Date: 05/01/2024 CLINICAL DATA:  Right hand pain after fall today. EXAM: RIGHT HAND - COMPLETE 3+ VIEW COMPARISON:  None Available. FINDINGS: Moderately displaced and comminuted fracture is seen involving the proximal base of the second metacarpal. No dislocation is noted. Joint spaces are otherwise unremarkable. IMPRESSION: Moderately displaced and comminuted proximal second metacarpal fracture. Electronically Signed   By: Rosalene Colon M.D.   On: 05/01/2024 15:28     Procedures   Medications Ordered in the ED  ketorolac  (TORADOL ) 15 MG/ML injection 15 mg (15 mg Intramuscular Given 05/01/24 1514)                                   Medical Decision Making Amount and/or Complexity of Data Reviewed Radiology: ordered.  Risk Prescription drug management.   This patient is a 33 year old male with mother who presents to the ED for concern of right hand pain after falling with closed fist onto her right fist.  Since then has had tingling and swelling to his dorsal aspect of right hand.  On physical exam, patient is in no acute distress, afebrile, alert and orient x 4, speaking in full sentences, nontachypneic, nontachycardic.  Notable swelling and tenderness to ovation over the dorsum of the right hand particularly over the second metacarpal, slight deformity noted near the wrist over the second carpal bone.  Normal ROM with 3rd through 5th phalanges as well as thumb.  Patient has limited ROM at wrist, secondary to pain.  Mild pain to palpation over distal wrist.  Exam is otherwise unremarkable with no other injuries noted.  X-ray revealed a moderately displaced, comminuted fracture to the second metacarpal on right hand.  Called orthopedic hand surgery, spoke to Steffanie Edouard, PA-C who wished for him to put in a radial gutter splint and follow-up with Dr. Annamae Barrett next week.   Patient was placed in a radial gutter splint and  was told to follow-up with hand surgery.  Will have him continue manage pain with Tylenol  and Profen at home as well as will send home with narcotic medication for uncontrolled pain.  Patient vital signs have remained stable throughout the course of patient's time in the ED. Low suspicion for any other emergent pathology at this time. I believe this patient is safe to be discharged. Provided strict return to ER precautions. Patient expressed agreement and understanding of plan. All questions were  answered.  Differential diagnoses prior to evaluation: The emergent differential diagnosis includes, but is not limited to, fracture, ligamentous injury, neurovascular injury, dislocation, malalignment, compartment syndrome. This is not an exhaustive differential.   Past Medical History / Co-morbidities / Social History: Sciatica, migraines, GERD  Additional history: Chart reviewed. Pertinent results include:   Last seen in the ED on 05/2023 for testicular mass, was to follow-up with urology.  Lab Tests/Imaging studies: I personally interpreted labs/imaging and the pertinent results include:   X-ray revealed a moderately displaced, comminuted fracture to the second metacarpal on right hand. I agree with the radiologist interpretation.  Medications: I ordered medication including Toradol .  I have reviewed the patients home medicines and have made adjustments as needed.  Critical Interventions: none  Social Determinants of Health: none  Disposition: After consideration of the diagnostic results and the patients response to treatment, I feel that the patient would benefit from discharge and treatment as above.   emergency department workup does not suggest an emergent condition requiring admission or immediate intervention beyond what has been performed at this time. The plan is: Follow-up with orthopedic surgery, return for any new or worsening symptoms, symptomatic management at home, elevate. The  patient is safe for discharge and has been instructed to return immediately for worsening symptoms, change in symptoms or any other concerns.    Final diagnoses:  Closed displaced fracture of base of second metacarpal bone of right hand, initial encounter    ED Discharge Orders          Ordered    oxyCODONE-acetaminophen  (PERCOCET/ROXICET) 5-325 MG tablet  Every 6 hours PRN        05/01/24 1604               Brookie Wayment S, PA-C 05/01/24 1605    Kingsley, Victoria K, DO 05/01/24 1704

## 2024-05-01 NOTE — Discharge Instructions (Addendum)
 You are seen today for a broken second metacarpal after a fall.  You will need to follow-up with Dr. Annamae Barrett with orthopedic hand surgery as well as continue to keep wrist elevated to help prevent increased swelling.  Please call their office tomorrow. Recommend you continue to use Tylenol  and ibuprofen for pain relief however if this is not able to manage pain, we will send home with some narcotic medication to use as needed.  This can cause constipation as well as bad dreams and does have a risk for addiction.  However patient uses for any uncontrolled pain.  Please continue to keep elevated above heart to help avoid swelling.  Recommend you elevated on a pillow when sleeping.  If you begin to have any new or worsening symptoms which would include worsening numbness, weakness, uncontrolled pain, return to the ED sooner for reevaluation.

## 2024-05-01 NOTE — Progress Notes (Signed)
 Orthopedic Tech Progress Note Patient Details:  Jesse Alexander 09-24-1991 [id number]  Ortho Devices Type of Ortho Device: Ace wrap, Cotton web roll, Rad Gutter splint Ortho Device/Splint Location: right radial gutter splint applied Ortho Device/Splint Interventions: Ordered, Application, Adjustment   Post Interventions Patient Tolerated: Well Instructions Provided: Adjustment of device, Care of device  [name] 05/01/2024, 4:06 PM
